# Patient Record
Sex: Male | Born: 1970 | Race: White | Hispanic: No | Marital: Married | Smoking: Never smoker
Health system: Southern US, Community
[De-identification: ages and names within clinical notes are randomized; demographics above are authoritative.]

## PROBLEM LIST (undated history)

## (undated) DIAGNOSIS — E119 Type 2 diabetes mellitus without complications: Secondary | ICD-10-CM

## (undated) DIAGNOSIS — K259 Gastric ulcer, unspecified as acute or chronic, without hemorrhage or perforation: Secondary | ICD-10-CM

## (undated) DIAGNOSIS — K5792 Diverticulitis of intestine, part unspecified, without perforation or abscess without bleeding: Secondary | ICD-10-CM

## (undated) DIAGNOSIS — J45909 Unspecified asthma, uncomplicated: Secondary | ICD-10-CM

## (undated) DIAGNOSIS — K589 Irritable bowel syndrome without diarrhea: Secondary | ICD-10-CM

## (undated) HISTORY — PX: COLON SURGERY: SHX602

---

## 2012-05-17 ENCOUNTER — Emergency Department (HOSPITAL_BASED_OUTPATIENT_CLINIC_OR_DEPARTMENT_OTHER)
Admission: EM | Admit: 2012-05-17 | Discharge: 2012-05-17 | Disposition: A | Discharge status: Home / Self Care | Payer: BC Managed Care – PPO | Attending: Emergency Medicine | Admitting: Emergency Medicine

## 2012-05-17 ENCOUNTER — Encounter (HOSPITAL_BASED_OUTPATIENT_CLINIC_OR_DEPARTMENT_OTHER): Payer: Self-pay | Admitting: Emergency Medicine

## 2012-05-17 DIAGNOSIS — R000 Tachycardia, unspecified: Secondary | ICD-10-CM | POA: Insufficient documentation

## 2012-05-17 DIAGNOSIS — R197 Diarrhea, unspecified: Secondary | ICD-10-CM | POA: Insufficient documentation

## 2012-05-17 DIAGNOSIS — J45909 Unspecified asthma, uncomplicated: Secondary | ICD-10-CM | POA: Insufficient documentation

## 2012-05-17 DIAGNOSIS — R112 Nausea with vomiting, unspecified: Secondary | ICD-10-CM | POA: Insufficient documentation

## 2012-05-17 DIAGNOSIS — E119 Type 2 diabetes mellitus without complications: Secondary | ICD-10-CM | POA: Insufficient documentation

## 2012-05-17 DIAGNOSIS — Z8719 Personal history of other diseases of the digestive system: Secondary | ICD-10-CM | POA: Insufficient documentation

## 2012-05-17 DIAGNOSIS — Z79899 Other long term (current) drug therapy: Secondary | ICD-10-CM | POA: Insufficient documentation

## 2012-05-17 HISTORY — DX: Irritable bowel syndrome, unspecified: K58.9

## 2012-05-17 HISTORY — DX: Type 2 diabetes mellitus without complications: E11.9

## 2012-05-17 HISTORY — DX: Unspecified asthma, uncomplicated: J45.909

## 2012-05-17 HISTORY — DX: Diverticulitis of intestine, part unspecified, without perforation or abscess without bleeding: K57.92

## 2012-05-17 LAB — BASIC METABOLIC PANEL
CO2: 23 mEq/L (ref 19–32)
Calcium: 9.4 mg/dL (ref 8.4–10.5)
Chloride: 102 mEq/L (ref 96–112)
Creatinine, Ser: 1 mg/dL (ref 0.50–1.35)
Glucose, Bld: 247 mg/dL — ABNORMAL HIGH (ref 70–99)

## 2012-05-17 MED ORDER — DICYCLOMINE HCL 10 MG PO CAPS
10.0000 mg | ORAL_CAPSULE | Freq: Once | ORAL | Status: AC
  Administered 2012-05-17: 10 mg via ORAL
  Filled 2012-05-17: qty 1

## 2012-05-17 MED ORDER — SODIUM CHLORIDE 0.9 % IV BOLUS (SEPSIS)
1000.0000 mL | Freq: Once | INTRAVENOUS | Status: AC
  Administered 2012-05-17: 1000 mL via INTRAVENOUS

## 2012-05-17 MED ORDER — ONDANSETRON 8 MG PO TBDP: ORAL_TABLET | ORAL | Status: DC

## 2012-05-17 MED ORDER — SODIUM CHLORIDE 0.9 % IV BOLUS (SEPSIS)
250.0000 mL | Freq: Once | INTRAVENOUS | Status: AC
  Administered 2012-05-17: 250 mL via INTRAVENOUS

## 2012-05-17 MED ORDER — SODIUM CHLORIDE 0.9 % IV BOLUS (SEPSIS): 500.0000 mL | Freq: Once | INTRAVENOUS | Status: DC

## 2012-05-17 MED ORDER — ONDANSETRON HCL 4 MG/2ML IJ SOLN
4.0000 mg | Freq: Once | INTRAMUSCULAR | Status: AC
  Administered 2012-05-17: 4 mg via INTRAVENOUS
  Filled 2012-05-17: qty 2

## 2012-05-17 MED ORDER — KETOROLAC TROMETHAMINE 30 MG/ML IJ SOLN
30.0000 mg | Freq: Once | INTRAMUSCULAR | Status: AC
  Administered 2012-05-17: 30 mg via INTRAVENOUS
  Filled 2012-05-17: qty 1

## 2012-05-17 NOTE — ED Notes (Signed)
MD at bedside. 

## 2012-05-17 NOTE — ED Provider Notes (Signed)
History     CSN: [id number]  Arrival date & time 05/17/12  7829   First MD Initiated Contact with Patient 05/17/12 0434      Chief Complaint  Patient presents with  . Emesis  . Diarrhea    (Consider location/radiation/quality/duration/timing/severity/associated sxs/prior treatment) Patient is a 41 y.o. male presenting with vomiting and diarrhea. The history is provided by the patient. No language interpreter was used.  Emesis Severity:  Moderate Timing:  Constant Quality:  Stomach contents Progression:  Unchanged Chronicity:  New Recent urination:  Normal Context: not post-tussive   Relieved by:  Nothing Worsened by:  Nothing tried Associated symptoms: diarrhea   Risk factors: sick contacts   Diarrhea Associated symptoms: vomiting   multiple family members with the same tonight  Past Medical History  Diagnosis Date  . Diabetes mellitus without complication   . Asthma   . Diverticulitis   . IBS (irritable bowel syndrome)     History reviewed. No pertinent past surgical history.  No family history on file.  History  Substance Use Topics  . Smoking status: Never Smoker   . Smokeless tobacco: Not on file  . Alcohol Use: No      Review of Systems  Gastrointestinal: Positive for vomiting and diarrhea.  All other systems reviewed and are negative.    Allergies  Biphosphate  Home Medications   Current Outpatient Rx  Name  Route  Sig  Dispense  Refill  . famotidine (PEPCID) 10 MG tablet   Oral   Take 10 mg by mouth 2 (two) times daily.         . fenofibrate (TRICOR) 145 MG tablet   Oral   Take 145 mg by mouth daily.         . sitaGLIPtan-metformin (JANUMET) 50-500 MG per tablet   Oral   Take 1 tablet by mouth 2 (two) times daily with a meal.           BP 127/95  Pulse 133  Temp(Src) 98.3 F (36.8 C) (Oral)  Resp 18  Ht 6\' 5"  (1.956 m)  Wt 311 lb (141.069 kg)  BMI 36.87 kg/m2  SpO2 96%  Physical Exam  Constitutional: He is  oriented to person, place, and time. He appears well-developed and well-nourished. No distress.  HENT:  Head: Normocephalic and atraumatic.  Mouth/Throat: Oropharynx is clear and moist.  Eyes: Conjunctivae are normal. Pupils are equal, round, and reactive to light.  Neck: Normal range of motion. Neck supple.  Cardiovascular: Intact distal pulses.  Tachycardia present.   Pulmonary/Chest: Effort normal and breath sounds normal. He has no wheezes. He has no rales.  Abdominal: Soft. Bowel sounds are normal. There is no tenderness. There is no rebound and no guarding.  Musculoskeletal: Normal range of motion.  Neurological: He is alert and oriented to person, place, and time.  Skin: Skin is warm and dry.  Psychiatric: He has a normal mood and affect.    ED Course  Procedures (including critical care time)  Labs Reviewed  BASIC METABOLIC PANEL   No results found.   No diagnosis found.    MDM  No further vomiting in the ED.  Multiple family members with same.  No indication for imaging at this time.  Return for worsening symptoms       Issiac Jamar K Tearsa Kowalewski-Rasch, MD 05/17/12 [id number]

## 2012-05-17 NOTE — ED Notes (Signed)
Vomiting and diarrhea x 2 hours.

## 2014-02-10 ENCOUNTER — Emergency Department (HOSPITAL_BASED_OUTPATIENT_CLINIC_OR_DEPARTMENT_OTHER): Payer: BC Managed Care – PPO

## 2014-02-10 ENCOUNTER — Encounter (HOSPITAL_BASED_OUTPATIENT_CLINIC_OR_DEPARTMENT_OTHER): Payer: Self-pay

## 2014-02-10 ENCOUNTER — Emergency Department (HOSPITAL_BASED_OUTPATIENT_CLINIC_OR_DEPARTMENT_OTHER)
Admission: EM | Admit: 2014-02-10 | Discharge: 2014-02-11 | Disposition: A | Discharge status: Home / Self Care | Payer: BC Managed Care – PPO | Attending: Emergency Medicine | Admitting: Emergency Medicine

## 2014-02-10 DIAGNOSIS — J45909 Unspecified asthma, uncomplicated: Secondary | ICD-10-CM | POA: Diagnosis not present

## 2014-02-10 DIAGNOSIS — Y9389 Activity, other specified: Secondary | ICD-10-CM | POA: Insufficient documentation

## 2014-02-10 DIAGNOSIS — Y998 Other external cause status: Secondary | ICD-10-CM | POA: Diagnosis not present

## 2014-02-10 DIAGNOSIS — L299 Pruritus, unspecified: Secondary | ICD-10-CM | POA: Diagnosis not present

## 2014-02-10 DIAGNOSIS — E119 Type 2 diabetes mellitus without complications: Secondary | ICD-10-CM | POA: Diagnosis not present

## 2014-02-10 DIAGNOSIS — Z79899 Other long term (current) drug therapy: Secondary | ICD-10-CM | POA: Diagnosis not present

## 2014-02-10 DIAGNOSIS — R1031 Right lower quadrant pain: Secondary | ICD-10-CM | POA: Insufficient documentation

## 2014-02-10 DIAGNOSIS — T373X5A Adverse effect of other antiprotozoal drugs, initial encounter: Secondary | ICD-10-CM | POA: Diagnosis not present

## 2014-02-10 DIAGNOSIS — Z880 Allergy status to penicillin: Secondary | ICD-10-CM | POA: Insufficient documentation

## 2014-02-10 DIAGNOSIS — Y9289 Other specified places as the place of occurrence of the external cause: Secondary | ICD-10-CM | POA: Insufficient documentation

## 2014-02-10 DIAGNOSIS — T7840XA Allergy, unspecified, initial encounter: Secondary | ICD-10-CM

## 2014-02-10 DIAGNOSIS — Z792 Long term (current) use of antibiotics: Secondary | ICD-10-CM | POA: Insufficient documentation

## 2014-02-10 DIAGNOSIS — R1032 Left lower quadrant pain: Secondary | ICD-10-CM | POA: Insufficient documentation

## 2014-02-10 DIAGNOSIS — R109 Unspecified abdominal pain: Secondary | ICD-10-CM

## 2014-02-10 DIAGNOSIS — R1310 Dysphagia, unspecified: Secondary | ICD-10-CM | POA: Insufficient documentation

## 2014-02-10 DIAGNOSIS — T368X5A Adverse effect of other systemic antibiotics, initial encounter: Secondary | ICD-10-CM | POA: Diagnosis not present

## 2014-02-10 LAB — URINALYSIS, ROUTINE W REFLEX MICROSCOPIC
Bilirubin Urine: NEGATIVE
Glucose, UA: NEGATIVE mg/dL
HGB URINE DIPSTICK: NEGATIVE
Ketones, ur: NEGATIVE mg/dL
LEUKOCYTES UA: NEGATIVE
NITRITE: NEGATIVE
Protein, ur: NEGATIVE mg/dL
SPECIFIC GRAVITY, URINE: 1.011 (ref 1.005–1.030)
UROBILINOGEN UA: 0.2 mg/dL (ref 0.0–1.0)
pH: 6.5 (ref 5.0–8.0)

## 2014-02-10 LAB — CBC WITH DIFFERENTIAL/PLATELET
BASOS ABS: 0.1 10*3/uL (ref 0.0–0.1)
BASOS PCT: 1 % (ref 0–1)
Eosinophils Absolute: 0.2 10*3/uL (ref 0.0–0.7)
Eosinophils Relative: 2 % (ref 0–5)
HCT: 41.1 % (ref 39.0–52.0)
HEMOGLOBIN: 13.6 g/dL (ref 13.0–17.0)
Lymphocytes Relative: 39 % (ref 12–46)
Lymphs Abs: 4.7 10*3/uL — ABNORMAL HIGH (ref 0.7–4.0)
MCH: 27.5 pg (ref 26.0–34.0)
MCHC: 33.1 g/dL (ref 30.0–36.0)
MCV: 83 fL (ref 78.0–100.0)
MONOS PCT: 7 % (ref 3–12)
Monocytes Absolute: 0.9 10*3/uL (ref 0.1–1.0)
NEUTROS ABS: 6.2 10*3/uL (ref 1.7–7.7)
NEUTROS PCT: 51 % (ref 43–77)
Platelets: 319 10*3/uL (ref 150–400)
RBC: 4.95 MIL/uL (ref 4.22–5.81)
RDW: 13.5 % (ref 11.5–15.5)
WBC: 12 10*3/uL — ABNORMAL HIGH (ref 4.0–10.5)

## 2014-02-10 LAB — COMPREHENSIVE METABOLIC PANEL
ALBUMIN: 3.7 g/dL (ref 3.5–5.2)
ALK PHOS: 26 U/L — AB (ref 39–117)
ALT: 25 U/L (ref 0–53)
AST: 17 U/L (ref 0–37)
Anion gap: 13 (ref 5–15)
BUN: 17 mg/dL (ref 6–23)
CO2: 22 mEq/L (ref 19–32)
Calcium: 8.8 mg/dL (ref 8.4–10.5)
Chloride: 102 mEq/L (ref 96–112)
Creatinine, Ser: 1.1 mg/dL (ref 0.50–1.35)
GFR calc Af Amer: >90 mL/min (ref >90)
GFR calc non Af Amer: 81 mL/min — ABNORMAL LOW (ref >90)
Glucose, Bld: 151 mg/dL — ABNORMAL HIGH (ref 70–99)
POTASSIUM: 3.8 meq/L (ref 3.7–5.3)
SODIUM: 137 meq/L (ref 137–147)
TOTAL PROTEIN: 6.6 g/dL (ref 6.0–8.3)
Total Bilirubin: 0.3 mg/dL (ref 0.3–1.2)

## 2014-02-10 MED ORDER — SODIUM CHLORIDE 0.9 % IV SOLN
Freq: Once | INTRAVENOUS | Status: AC
  Administered 2014-02-10: 500 mL via INTRAVENOUS

## 2014-02-10 MED ORDER — METHYLPREDNISOLONE SODIUM SUCC 125 MG IJ SOLR
INTRAMUSCULAR | Status: AC
  Administered 2014-02-10: 125 mg
  Filled 2014-02-10: qty 2

## 2014-02-10 MED ORDER — FAMOTIDINE IN NACL 20-0.9 MG/50ML-% IV SOLN
INTRAVENOUS | Status: AC
  Administered 2014-02-10: 20 mg
  Filled 2014-02-10: qty 50

## 2014-02-10 MED ORDER — DIPHENHYDRAMINE HCL 50 MG/ML IJ SOLN
INTRAMUSCULAR | Status: AC
  Administered 2014-02-10: 25 mg
  Filled 2014-02-10: qty 1

## 2014-02-10 NOTE — ED Provider Notes (Signed)
CSN: [id number]     Arrival date & time 02/10/14  2243 History  This chart was scribed for Geoffery Lyonsouglas Robbin Loughmiller, MD by Ronney LionSuzanne Le, ED Scribe. This patient was seen in room MH11/MH11 and the patient's care was started at 11:09 PM.    Chief Complaint  Patient presents with  . Allergic Reaction   Patient is a 43 y.o. male presenting with allergic reaction. The history is provided by the patient. No language interpreter was used.  Allergic Reaction Presenting symptoms: difficulty swallowing and itching   Difficulty swallowing:    Onset quality:  Sudden   Duration:  1 hour   Timing:  Unable to specify   Progression:  Partially resolved Itching:    Location:  Full body Severity:  Unable to specify Prior allergic episodes:  No prior episodes (No prior reactions to same medications.) Context: medications   Relieved by:  Nothing Worsened by:  Nothing tried Ineffective treatments:  None tried  HPI Comments: Richard Murray is a 43 y.o. male who presents to the Emergency Department complaining of a suspected allergic reaction to medication. After taking first dose of new course of cipro and flagyl at home, patient reports itching eyes and lips that spread to head, back, and feet that started about 8 minutes after taking it. He also reports that his hands got hot, and lips and tongue became numb. He also reports mild swelling of his tongue and mild dysphasia. Patient was taking the flagyl due to his doctor suspecting diverticulitis flareup. He has taken the same medication before with no problems. He reports abdominal pain mostly LLQ but some in RLQ that started one week ago. Recent blood work done by PCP suggested infection. The only recent change of medicine has been the addition of lisinopril, which he has been taking for several months. Patient denies SOB and fever.  Past Medical History  Diagnosis Date  . Diabetes mellitus without complication   . Asthma   . Diverticulitis   . IBS (irritable bowel  syndrome)   . Diverticulitis    History reviewed. No pertinent past surgical history. No family history on file. History  Substance Use Topics  . Smoking status: Never Smoker   . Smokeless tobacco: Not on file  . Alcohol Use: Yes    Review of Systems  Constitutional: Negative for fever.  HENT: Positive for trouble swallowing.   Respiratory: Negative for shortness of breath.   Gastrointestinal: Positive for abdominal pain.  Skin: Positive for itching.  All other systems reviewed and are negative.     Allergies  Biphosphate and Penicillins  Home Medications   Prior to Admission medications   Medication Sig Start Date End Date Taking? Authorizing Provider  Ciprofloxacin (CIPRO PO) Take by mouth.   Yes Historical Provider, MD  Liraglutide (VICTOZA [place]) Inject into the skin.   Yes Historical Provider, MD  MetroNIDAZOLE (FLAGYL PO) Take by mouth.   Yes Historical Provider, MD  famotidine (PEPCID) 10 MG tablet Take 10 mg by mouth 2 (two) times daily.    Historical Provider, MD  fenofibrate (TRICOR) 145 MG tablet Take 145 mg by mouth daily.    Historical Provider, MD  sitaGLIPtan-metformin (JANUMET) 50-500 MG per tablet Take 1 tablet by mouth 2 (two) times daily with a meal.    Historical Provider, MD   BP 141/92 mmHg  Pulse 118  Temp(Src) 97.9 F (36.6 C) (Oral)  Resp 26  Ht 6\' 5"  (1.956 m)  Wt 302 lb (136.986 kg)  BMI 35.80  kg/m2  SpO2 98% Physical Exam  Constitutional: He is oriented to person, place, and time. He appears well-developed. No distress.  HENT:  Head: Normocephalic and atraumatic.  Mouth/Throat: Oropharynx is clear and moist.  There is mild swelling of the tongue.   Eyes: Conjunctivae and EOM are normal.  Cardiovascular: Normal rate and regular rhythm.   Pulmonary/Chest: Effort normal. No stridor. No respiratory distress.  Abdominal: He exhibits no distension. There is tenderness. There is no rebound and no guarding.  TTP in the left and right lower  quadrants.  Musculoskeletal: He exhibits no edema.  Neurological: He is alert and oriented to person, place, and time.  Skin: Skin is warm and dry. No rash noted.  Psychiatric: He has a normal mood and affect.  Nursing note and vitals reviewed.   ED Course  Procedures (including critical care time)  DIAGNOSTIC STUDIES: Oxygen Saturation is 98% on room air, normal by my interpretation.    COORDINATION OF CARE: 11:15 PM-Discussed treatment plan which includes CAT scan with pt at bedside and pt agreed to plan.   Labs Review Labs Reviewed - No data to display  Imaging Review No results found.   EKG Interpretation None      MDM   Final diagnoses:  None    Patient presents here with complaints of itchy eyes, swelling of his hands and feet, and swelling to his tongue that started almost immediately after taking Cipro and Flagyl for presumed diverticulitis. He reports having some lower abdominal discomfort for the past several days. He was seen by his doctor and had blood work performed. This showed an elevated white count and was treated with these medications.  Workup today reveals a white count of 12,000, however CT scan is negative for diverticulitis or appendicitis. I will treat with prednisone and Benadryl for a presumed allergic reaction to his antibiotics. He is to return if his symptoms substantially worsen or change.  I have also considered the possibility of angioedema as the patient is taking an ACE inhibitor. Due to the timing of the reaction to the antibiotic, I have a higher suspicion that this was an allergy to either Cipro or Flagyl.  I personally performed the services described in this documentation, which was scribed in my presence. The recorded information has been reviewed and is accurate.      Geoffery Lyonsouglas Leyland Kenna, MD 02/11/14 [phone]

## 2014-02-10 NOTE — ED Notes (Signed)
After taking cipro and flagyl pt started having itching eyes, tingling in arms and feet, swelling to lips and tongue, no diff breathing,  Redness over body

## 2014-02-10 NOTE — ED Notes (Signed)
C/o eyes itching, tongue itching approx 10 min after meds at home-en route to ED pt c/o trouble swallowing

## 2014-02-11 MED ORDER — PREDNISONE 10 MG PO TABS: 20.0000 mg | ORAL_TABLET | Freq: Two times a day (BID) | ORAL | Status: DC

## 2014-02-11 MED ORDER — IOHEXOL 300 MG/ML  SOLN
100.0000 mL | Freq: Once | INTRAMUSCULAR | Status: AC | PRN
  Administered 2014-02-11: 100 mL via INTRAVENOUS

## 2014-02-11 MED ORDER — IOHEXOL 300 MG/ML  SOLN
25.0000 mL | Freq: Once | INTRAMUSCULAR | Status: AC | PRN
  Administered 2014-02-11: 25 mL via ORAL

## 2014-02-11 NOTE — ED Notes (Signed)
Patient transported to CT 

## 2014-02-11 NOTE — Discharge Instructions (Signed)
Prednisone as prescribed.  Benadryl 25 mg every 6 hours for the next 3 days.  Return to the emergency department if he develops difficulty breathing, worsening abdominal pain, bloody stool, or other new and concerning symptoms.   Drug Allergy Allergic reactions to medicines are common. Some allergic reactions are mild. A delayed type of drug allergy that occurs 1 week or more after exposure to a medicine or vaccine is called serum sickness. A life-threatening, sudden (acute) allergic reaction that involves the whole body is called anaphylaxis. CAUSES  "True" drug allergies occur when there is an allergic reaction to a medicine. This is caused by overactivity of the immune system. First, the body becomes sensitized. The immune system is triggered by your first exposure to the medicine. Following this first exposure, future exposure to the same medicine may be life-threatening. Almost any medicine can cause an allergic reaction. Common ones are:  Penicillin.  Sulfonamides (sulfa drugs).  Local anesthetics.  X-ray dyes that contain iodine. SYMPTOMS  Common symptoms of a minor allergic reaction are:  Swelling around the mouth.  An itchy red rash or hives.  Vomiting or diarrhea. Anaphylaxis can cause swelling of the mouth and throat. This makes it difficult to breathe and swallow. Severe reactions can be fatal within seconds, even after exposure to only a trace amount of the drug that causes the reaction. HOME CARE INSTRUCTIONS   If you are unsure of what caused your reaction, keep a diary of foods and medicines used. Include the symptoms that followed. Avoid anything that causes reactions.  You may want to follow up with an allergy specialist after the reaction has cleared in order to be tested to confirm the allergy. It is important to confirm that your reaction is an allergy, not just a side effect to the medicine. If you have a true allergy to a medicine, this may prevent that  medicine and related medicines from being given to you when you are very ill.  If you have hives or a rash:  Take medicines as directed by your caregiver.  You may use an over-the-counter antihistamine (diphenhydramine) as needed.  Apply cold compresses to the skin or take baths in cool water. Avoid hot baths or showers.  If you are severely allergic:  Continuous observation after a severe reaction may be needed. Hospitalization is often required.  Wear a medical alert bracelet or necklace stating your allergy.  You and your family must learn how to use an anaphylaxis kit or give an epinephrine injection to temporarily treat an emergency allergic reaction. If you have had a severe reaction, always carry your epinephrine injection or anaphylaxis kit with you. This can be lifesaving if you have a severe reaction.  Do not drive or perform tasks after treatment until the medicines used to treat your reaction have worn off, or until your caregiver says it is okay. SEEK MEDICAL CARE IF:   You think you had an allergic reaction. Symptoms usually start within 30 minutes after exposure.  Symptoms are getting worse rather than better.  You develop new symptoms.  The symptoms that brought you to your caregiver return. SEEK IMMEDIATE MEDICAL CARE IF:   You have swelling of the mouth, difficulty breathing, or wheezing.  You have a tight feeling in your chest or throat.  You develop hives, swelling, or itching all over your body.  You develop severe vomiting or diarrhea.  You feel faint or pass out. This is an emergency. Use your epinephrine injection or anaphylaxis  kit as you have been instructed. Call for emergency medical help. Even if you improve after the injection, you need to be examined at a hospital emergency department. MAKE SURE YOU:   Understand these instructions.  Will watch your condition.  Will get help right away if you are not doing well or get worse. Document  Released: 03/19/2005 Document Revised: 06/11/2011 Document Reviewed: 08/23/2010 [facility] Patient Information 2015 [place], [place]. This information is not intended to replace advice given to you by your health care provider. Make sure you discuss any questions you have with your health care provider.  Abdominal Pain Many things can cause abdominal pain. Usually, abdominal pain is not caused by a disease and will improve without treatment. It can often be observed and treated at home. Your health care provider will do a physical exam and possibly order blood tests and X-rays to help determine the seriousness of your pain. However, in many cases, more time must pass before a clear cause of the pain can be found. Before that point, your health care provider may not know if you need more testing or further treatment. HOME CARE INSTRUCTIONS  Monitor your abdominal pain for any changes. The following actions may help to alleviate any discomfort you are experiencing:  Only take over-the-counter or prescription medicines as directed by your health care provider.  Do not take laxatives unless directed to do so by your health care provider.  Try a clear liquid diet (broth, tea, or water) as directed by your health care provider. Slowly move to a bland diet as tolerated. SEEK MEDICAL CARE IF:  You have unexplained abdominal pain.  You have abdominal pain associated with nausea or diarrhea.  You have pain when you urinate or have a bowel movement.  You experience abdominal pain that wakes you in the night.  You have abdominal pain that is worsened or improved by eating food.  You have abdominal pain that is worsened with eating fatty foods.  You have a fever. SEEK IMMEDIATE MEDICAL CARE IF:   Your pain does not go away within 2 hours.  You keep throwing up (vomiting).  Your pain is felt only in portions of the abdomen, such as the right side or the left lower portion of the abdomen.  You pass  bloody or black tarry stools. MAKE SURE YOU:  Understand these instructions.   Will watch your condition.   Will get help right away if you are not doing well or get worse.  Document Released: 12/27/2004 Document Revised: 03/24/2013 Document Reviewed: 11/26/2012 [facility] Patient Information 2015 [place], [place]. This information is not intended to replace advice given to you by your health care provider. Make sure you discuss any questions you have with your health care provider.

## 2014-02-11 NOTE — ED Notes (Signed)
Swelling is less  Pt states he feels a lot better

## 2015-01-12 ENCOUNTER — Emergency Department (HOSPITAL_BASED_OUTPATIENT_CLINIC_OR_DEPARTMENT_OTHER): Payer: BLUE CROSS/BLUE SHIELD

## 2015-01-12 ENCOUNTER — Encounter (HOSPITAL_BASED_OUTPATIENT_CLINIC_OR_DEPARTMENT_OTHER): Payer: Self-pay

## 2015-01-12 ENCOUNTER — Emergency Department (HOSPITAL_BASED_OUTPATIENT_CLINIC_OR_DEPARTMENT_OTHER)
Admission: EM | Admit: 2015-01-12 | Discharge: 2015-01-13 | Disposition: A | Discharge status: Other Acute Inpatient Hospital | Payer: BLUE CROSS/BLUE SHIELD | Attending: Emergency Medicine | Admitting: Emergency Medicine

## 2015-01-12 DIAGNOSIS — Z7984 Long term (current) use of oral hypoglycemic drugs: Secondary | ICD-10-CM | POA: Insufficient documentation

## 2015-01-12 DIAGNOSIS — K5732 Diverticulitis of large intestine without perforation or abscess without bleeding: Secondary | ICD-10-CM | POA: Diagnosis not present

## 2015-01-12 DIAGNOSIS — Z794 Long term (current) use of insulin: Secondary | ICD-10-CM | POA: Insufficient documentation

## 2015-01-12 DIAGNOSIS — E119 Type 2 diabetes mellitus without complications: Secondary | ICD-10-CM | POA: Insufficient documentation

## 2015-01-12 DIAGNOSIS — Z79899 Other long term (current) drug therapy: Secondary | ICD-10-CM | POA: Diagnosis not present

## 2015-01-12 DIAGNOSIS — R109 Unspecified abdominal pain: Secondary | ICD-10-CM | POA: Diagnosis present

## 2015-01-12 DIAGNOSIS — M545 Low back pain: Secondary | ICD-10-CM | POA: Diagnosis not present

## 2015-01-12 DIAGNOSIS — J45909 Unspecified asthma, uncomplicated: Secondary | ICD-10-CM | POA: Diagnosis not present

## 2015-01-12 DIAGNOSIS — Z880 Allergy status to penicillin: Secondary | ICD-10-CM | POA: Insufficient documentation

## 2015-01-12 DIAGNOSIS — K5792 Diverticulitis of intestine, part unspecified, without perforation or abscess without bleeding: Secondary | ICD-10-CM

## 2015-01-12 HISTORY — DX: Gastric ulcer, unspecified as acute or chronic, without hemorrhage or perforation: K25.9

## 2015-01-12 LAB — DIFFERENTIAL
BASOS PCT: 1 %
Basophils Absolute: 0.1 10*3/uL (ref 0.0–0.1)
Eosinophils Absolute: 0.3 10*3/uL (ref 0.0–0.7)
Eosinophils Relative: 2 %
LYMPHS ABS: 3.3 10*3/uL (ref 0.7–4.0)
LYMPHS PCT: 21 %
MONO ABS: 1.2 10*3/uL — AB (ref 0.1–1.0)
MONOS PCT: 8 %
NEUTROS ABS: 10.7 10*3/uL — AB (ref 1.7–7.7)
Neutrophils Relative %: 68 %

## 2015-01-12 LAB — CBC
HEMATOCRIT: 42.5 % (ref 39.0–52.0)
HEMOGLOBIN: 14.1 g/dL (ref 13.0–17.0)
MCH: 27.5 pg (ref 26.0–34.0)
MCHC: 33.2 g/dL (ref 30.0–36.0)
MCV: 82.8 fL (ref 78.0–100.0)
Platelets: 299 10*3/uL (ref 150–400)
RBC: 5.13 MIL/uL (ref 4.22–5.81)
RDW: 13.5 % (ref 11.5–15.5)
WBC: 15.6 10*3/uL — AB (ref 4.0–10.5)

## 2015-01-12 LAB — URINALYSIS, ROUTINE W REFLEX MICROSCOPIC
Bilirubin Urine: NEGATIVE
GLUCOSE, UA: NEGATIVE mg/dL
HGB URINE DIPSTICK: NEGATIVE
Ketones, ur: NEGATIVE mg/dL
Leukocytes, UA: NEGATIVE
Nitrite: NEGATIVE
Protein, ur: NEGATIVE mg/dL
SPECIFIC GRAVITY, URINE: 1.02 (ref 1.005–1.030)
UROBILINOGEN UA: 0.2 mg/dL (ref 0.0–1.0)
pH: 6.5 (ref 5.0–8.0)

## 2015-01-12 LAB — BASIC METABOLIC PANEL
Anion gap: 9 (ref 5–15)
BUN: 13 mg/dL (ref 6–20)
CHLORIDE: 102 mmol/L (ref 101–111)
CO2: 26 mmol/L (ref 22–32)
Calcium: 9.2 mg/dL (ref 8.9–10.3)
Creatinine, Ser: 0.97 mg/dL (ref 0.61–1.24)
GFR calc Af Amer: >60 mL/min (ref >60)
GFR calc non Af Amer: >60 mL/min (ref >60)
Glucose, Bld: 136 mg/dL — ABNORMAL HIGH (ref 65–99)
POTASSIUM: 3.9 mmol/L (ref 3.5–5.1)
SODIUM: 137 mmol/L (ref 135–145)

## 2015-01-12 LAB — OCCULT BLOOD X 1 CARD TO LAB, STOOL: Fecal Occult Bld: NEGATIVE

## 2015-01-12 MED ORDER — MORPHINE SULFATE (PF) 4 MG/ML IV SOLN
4.0000 mg | Freq: Once | INTRAVENOUS | Status: AC
  Administered 2015-01-12: 4 mg via INTRAVENOUS
  Filled 2015-01-12: qty 1

## 2015-01-12 MED ORDER — ONDANSETRON HCL 4 MG/2ML IJ SOLN
4.0000 mg | Freq: Once | INTRAMUSCULAR | Status: AC
  Administered 2015-01-12: 4 mg via INTRAVENOUS
  Filled 2015-01-12: qty 2

## 2015-01-12 MED ORDER — HYDROMORPHONE HCL 1 MG/ML IJ SOLN
1.0000 mg | Freq: Once | INTRAMUSCULAR | Status: AC
  Administered 2015-01-12: 1 mg via INTRAVENOUS
  Filled 2015-01-12: qty 1

## 2015-01-12 MED ORDER — PIPERACILLIN-TAZOBACTAM 3.375 G IVPB 30 MIN
3.3750 g | Freq: Once | INTRAVENOUS | Status: AC
  Administered 2015-01-12: 3.375 g via INTRAVENOUS
  Filled 2015-01-12 (×2): qty 50

## 2015-01-12 MED ORDER — SODIUM CHLORIDE 0.9 % IV BOLUS (SEPSIS)
1000.0000 mL | Freq: Once | INTRAVENOUS | Status: AC
  Administered 2015-01-12: 1000 mL via INTRAVENOUS

## 2015-01-12 MED ORDER — IOHEXOL 300 MG/ML  SOLN
125.0000 mL | Freq: Once | INTRAMUSCULAR | Status: AC | PRN
  Administered 2015-01-12: 125 mL via INTRAVENOUS

## 2015-01-12 MED ORDER — IOHEXOL 300 MG/ML  SOLN
50.0000 mL | Freq: Once | INTRAMUSCULAR | Status: AC | PRN
  Administered 2015-01-12: 50 mL via ORAL

## 2015-01-12 NOTE — ED Provider Notes (Signed)
CSN: [id number]   Arrival date & time 01/12/15 1925  History  By signing my name below, I, Bethel Born, attest that this documentation has been prepared under the direction and in the presence of Richardean Canal, MD. Electronically Signed: Bethel Born, ED Scribe. 01/12/2015. 9:11 PM.  Chief Complaint  Patient presents with  . Flank Pain    HPI The history is provided by the patient. No language interpreter was used.   Richard Murray is a 44 y.o. male with history of diverticulitis s/p partial colectomy, IBS, a bleeding ulcer who presents to the Emergency Department complaining of constant and worsening left-sided abdominal pain with onset 3 days ago. Associated symptoms include increasingly dark stool over the last 3 days (notes 1 definitively black stool today), nausea, and new back pain. Pt denies fever, vomiting, constipation, diarrhea, and dysuria. He saw his PCP this morning where he had lab work done that he states showed a small amount of blood in his urine and normal LFTs. He has a scheduled CT scan in 2 days.    Past Medical History  Diagnosis Date  . Diabetes mellitus without complication (HCC)   . Asthma   . Diverticulitis   . IBS (irritable bowel syndrome)   . Diverticulitis   . Gastric ulcer     Past Surgical History  Procedure Laterality Date  . Colon surgery      No family history on file.  Social History  Substance Use Topics  . Smoking status: Never Smoker   . Smokeless tobacco: None  . Alcohol Use: No     Review of Systems  Constitutional: Negative for fever.  Gastrointestinal: Positive for nausea and abdominal pain. Negative for vomiting, diarrhea and constipation.       Dark stool  Genitourinary: Negative for dysuria.  Musculoskeletal: Positive for back pain.   Home Medications   Prior to Admission medications   Medication Sig Start Date End Date Taking? Authorizing Provider  Ascorbic Acid (VITAMIN C) 100 MG tablet Take 100 mg by mouth daily.    Yes Historical Provider, MD  Cholecalciferol (VITAMIN D PO) Take by mouth.   Yes Historical Provider, MD  Inulin (FIBER CHOICE PO) Take by mouth.   Yes Historical Provider, MD  LISINOPRIL PO Take by mouth.   Yes Historical Provider, MD  famotidine (PEPCID) 10 MG tablet Take 10 mg by mouth 2 (two) times daily.    Historical Provider, MD  fenofibrate (TRICOR) 145 MG tablet Take 145 mg by mouth daily.    Historical Provider, MD  Liraglutide (VICTOZA [place]) Inject into the skin.    Historical Provider, MD  sitaGLIPtan-metformin (JANUMET) 50-500 MG per tablet Take 1 tablet by mouth 2 (two) times daily with a meal.    Historical Provider, MD    Allergies  Biphosphate; Ciprofloxacin hcl; Flagyl; and Penicillins  Triage Vitals: BP 131/95 mmHg  Pulse 101  Temp(Src) 99.5 F (37.5 C) (Oral)  Resp 18  Ht  (1.981 m)  Wt 303 lb (137.44 kg)  BMI 35.02 kg/m2  SpO2 98%  Physical Exam  Constitutional: He is oriented to person, place, and time. He appears well-developed and well-nourished.  HENT:  Head: Normocephalic and atraumatic.  Eyes: EOM are normal.  Neck: Normal range of motion.  Cardiovascular: Normal rate, regular rhythm, normal heart sounds and intact distal pulses.   Pulmonary/Chest: Effort normal and breath sounds normal. No respiratory distress.  Abdominal: Soft. He exhibits no distension. There is tenderness (LLQ). There is no rebound.  Musculoskeletal: Normal range of motion.  Neurological: He is alert and oriented to person, place, and time.  Skin: Skin is warm and dry.  Psychiatric: He has a normal mood and affect. Judgment normal.  Nursing note and vitals reviewed.   ED Course  Procedures   DIAGNOSTIC STUDIES: Oxygen Saturation is 98% on RA, normal by my interpretation.    COORDINATION OF CARE: 9:05 PM Discussed treatment plan which includes lab work, CT A/P with contrast, Zofran, morphine, IVF with pt at bedside and pt agreed to plan.  Labs Reviewed  BASIC  METABOLIC PANEL - Abnormal; Notable for the following:    Glucose, Bld 136 (*)    All other components within normal limits  CBC - Abnormal; Notable for the following:    WBC 15.6 (*)    All other components within normal limits  DIFFERENTIAL - Abnormal; Notable for the following:    Neutro Abs 10.7 (*)    Monocytes Absolute 1.2 (*)    All other components within normal limits  URINALYSIS, ROUTINE W REFLEX MICROSCOPIC (NOT AT [facility])  OCCULT BLOOD X 1 CARD TO LAB, STOOL    Imaging Review Ct Abdomen Pelvis W Contrast  01/12/2015  CLINICAL DATA:  Left lower quadrant pain for 3 days. Dark stools. White cell count 15.6. Left colectomy for diverticulitis on 03/16. EXAM: CT ABDOMEN AND PELVIS WITH CONTRAST TECHNIQUE: Multidetector CT imaging of the abdomen and pelvis was performed using the standard protocol following bolus administration of intravenous contrast. CONTRAST:  OMNIPAQUE IOHEXOL 300 MG/ML SOLN, 50mL OMNIPAQUE IOHEXOL 300 MG/ML SOLN COMPARISON:  02/11/2014 FINDINGS: Mild dependent changes in the lung bases. Diffuse fatty infiltration of the liver. No focal liver lesions. Small stone in the gallbladder neck. No gallbladder wall thickening or inflammation. The pancreas, adrenal glands, abdominal aorta, inferior vena cava, and retroperitoneal lymph nodes are unremarkable. Small cyst in the lower pole of the left kidney measuring 1.6 cm diameter. No change since prior study. Renal nephrograms are symmetrical. No hydronephrosis or hydroureter. Sub cm focal low-attenuation lesion in the inferior spleen likely represents a small cyst or hemangioma. This is unchanged since previous study. Stomach and small bowel appear normal. Scattered stool in the colon without abnormal distention. Scattered diverticula in the colon. In the mid to low descending colon, there is focal wall thickening with pericolonic infiltration consistent with acute diverticulitis. No abscess. No free air or free fluid in the  abdomen. Small umbilical hernia containing fat. Pelvis: Appendix is normal. Surgical anastomosis in the mid sigmoid region. Mild infiltration in the adjacent sigmoid colon fat possibly due to fat necrosis in the postoperative setting. No fluid collections demonstrated in the pelvis. Scattered colonic diverticula. Bladder is decompressed. Calcification in the prostate gland. No pelvic mass or lymphadenopathy. IMPRESSION: Diverticulosis of the colon with inflammatory changes around the mid/ distal descending colon consistent with diverticulitis. No abscess. Probable postoperative fat necrosis adjacent to the sigmoid colon. Small umbilical hernia containing fat. Diffuse fatty infiltration of the liver. Cholelithiasis without inflammatory change. Electronically Signed   By: Burman Nieves M.D.   On: 01/12/2015 23:12    I personally reviewed and evaluated these images and lab results as a part of my medical decision-making.    MDM   Final diagnoses:  None   Arshawn Valdez is a 44 y.o. male here with LLQ, dark stools. Consider diverticulitis since patient has recurrent diverticulitis requiring surgery. Also consider GI bleed given dark stools. Will check labs, guiac, CT ab/pel.   11:41  PM CT showed diverticulitis, postop fat necrocis adjacent to sigmoid colon, no abscess. WBC 15. Has multiple allergies but tolerated augmentin and amoxicillin in the past. Will try zosyn. Will admit to [facility]. Dr. Pilar GrammesHounnou accepting doctor.   I personally performed the services described in this documentation, which was scribed in my presence. The recorded information has been reviewed and is accurate.   Richardean Canalavid H Nechelle Petrizzo, MD 01/12/15 [phone]

## 2015-01-12 NOTE — ED Notes (Signed)
Patient transported to CT 

## 2015-01-12 NOTE — ED Notes (Signed)
MD at bedside. 

## 2015-01-12 NOTE — ED Notes (Signed)
Left flank pain x 3 days-was seen by PCP today for same-mid bad pain started this afternoon-dark stools x 3 days-did not address with PCP today with black stool later today

## 2015-01-13 DIAGNOSIS — Z79899 Other long term (current) drug therapy: Secondary | ICD-10-CM | POA: Diagnosis not present

## 2015-01-13 DIAGNOSIS — E119 Type 2 diabetes mellitus without complications: Secondary | ICD-10-CM | POA: Diagnosis not present

## 2015-01-13 DIAGNOSIS — Z7984 Long term (current) use of oral hypoglycemic drugs: Secondary | ICD-10-CM | POA: Diagnosis not present

## 2015-01-13 DIAGNOSIS — R109 Unspecified abdominal pain: Secondary | ICD-10-CM | POA: Diagnosis present

## 2015-01-13 DIAGNOSIS — K5732 Diverticulitis of large intestine without perforation or abscess without bleeding: Secondary | ICD-10-CM | POA: Diagnosis not present

## 2015-01-13 DIAGNOSIS — J45909 Unspecified asthma, uncomplicated: Secondary | ICD-10-CM | POA: Diagnosis not present

## 2015-01-13 DIAGNOSIS — M545 Low back pain: Secondary | ICD-10-CM | POA: Diagnosis not present

## 2015-01-13 DIAGNOSIS — Z880 Allergy status to penicillin: Secondary | ICD-10-CM | POA: Diagnosis not present

## 2015-01-13 DIAGNOSIS — Z794 Long term (current) use of insulin: Secondary | ICD-10-CM | POA: Diagnosis not present

## 2015-01-13 MED ORDER — HYDROMORPHONE HCL 1 MG/ML IJ SOLN
1.0000 mg | Freq: Once | INTRAMUSCULAR | Status: DC | PRN
  Administered 2015-01-13: 1 mg via INTRAVENOUS
  Filled 2015-01-13: qty 1

## 2016-05-16 ENCOUNTER — Encounter (HOSPITAL_BASED_OUTPATIENT_CLINIC_OR_DEPARTMENT_OTHER): Payer: Self-pay | Admitting: Emergency Medicine

## 2016-05-16 ENCOUNTER — Emergency Department (HOSPITAL_BASED_OUTPATIENT_CLINIC_OR_DEPARTMENT_OTHER)
Admission: EM | Admit: 2016-05-16 | Discharge: 2016-05-16 | Disposition: A | Discharge status: Home / Self Care | Payer: 59 | Attending: Emergency Medicine | Admitting: Emergency Medicine

## 2016-05-16 DIAGNOSIS — R110 Nausea: Secondary | ICD-10-CM | POA: Insufficient documentation

## 2016-05-16 DIAGNOSIS — M791 Myalgia: Secondary | ICD-10-CM | POA: Insufficient documentation

## 2016-05-16 DIAGNOSIS — I10 Essential (primary) hypertension: Secondary | ICD-10-CM | POA: Insufficient documentation

## 2016-05-16 DIAGNOSIS — J111 Influenza due to unidentified influenza virus with other respiratory manifestations: Secondary | ICD-10-CM

## 2016-05-16 DIAGNOSIS — R05 Cough: Secondary | ICD-10-CM | POA: Diagnosis present

## 2016-05-16 DIAGNOSIS — E119 Type 2 diabetes mellitus without complications: Secondary | ICD-10-CM | POA: Diagnosis not present

## 2016-05-16 DIAGNOSIS — R509 Fever, unspecified: Secondary | ICD-10-CM | POA: Insufficient documentation

## 2016-05-16 DIAGNOSIS — R69 Illness, unspecified: Secondary | ICD-10-CM

## 2016-05-16 MED ORDER — ONDANSETRON 4 MG PO TBDP
4.0000 mg | ORAL_TABLET | Freq: Once | ORAL | Status: AC
Start: 2016-05-16 — End: 2016-05-16
  Administered 2016-05-16: 4 mg via ORAL
  Filled 2016-05-16: qty 1

## 2016-05-16 MED ORDER — OSELTAMIVIR PHOSPHATE 75 MG PO CAPS: 75.0000 mg | ORAL_CAPSULE | Freq: Two times a day (BID) | ORAL | 0 refills | Status: DC

## 2016-05-16 MED ORDER — ONDANSETRON 4 MG PO TBDP: 4.0000 mg | ORAL_TABLET | Freq: Three times a day (TID) | ORAL | 0 refills | Status: DC | PRN

## 2016-05-16 NOTE — ED Provider Notes (Signed)
[facility]-EMERGENCY DEPT Provider Note   CSN: [id number] Arrival date & time: 05/16/16  2030  By signing my name below, I, Doreatha [id number] Mathews, attest that this documentation has been prepared under the direction and in the presence of Jerelyn ScottMartha Linker, MD. Electronically Signed: Doreatha MartinEva Mathews, ED Scribe. 05/16/16. 10:48 PM.     History   Chief Complaint Chief Complaint  Patient presents with  . URI    HPI Richard Murray is a 46 y.o. male with h/o asthma who presents to the Emergency Department complaining of moderate, sudden onset generalized body aches that began this afternoon with associated subjective fever, chills, productive cough, nausea. Per pt, he was ill with a viral illness 2 weeks ago and was feeling otherwise well until today. Pt states his cough is worsened with exertion. No alleviating factors noted. He reports sick contact with other family members and co-workers who have the flu. Pt reports he had a flu shot this year. Pt is a non-smoker. He denies vomiting.   The history is provided by the patient. No language interpreter was used.  Influenza  Presenting symptoms: cough, fever (subjective), myalgias (generalized) and nausea   Presenting symptoms: no vomiting   Severity:  Moderate Onset quality:  Sudden Duration:  1 day Progression:  Worsening Chronicity:  New Relieved by:  Nothing Worsened by:  Movement Ineffective treatments:  None tried Associated symptoms: chills   Risk factors: sick contacts     Past Medical History:  Diagnosis Date  . Asthma   . Diabetes mellitus without complication (HCC)   . Diverticulitis   . Diverticulitis   . Gastric ulcer   . IBS (irritable bowel syndrome)     There are no active problems to display for this patient.   Past Surgical History:  Procedure Laterality Date  . COLON SURGERY         Home Medications    Prior to Admission medications   Medication Sig Start Date End Date Taking? Authorizing Provider  Ascorbic Acid  (VITAMIN C) 100 MG tablet Take 100 mg by mouth daily.    Historical Provider, MD  Cholecalciferol (VITAMIN D PO) Take by mouth.    Historical Provider, MD  famotidine (PEPCID) 10 MG tablet Take 10 mg by mouth 2 (two) times daily.    Historical Provider, MD  fenofibrate (TRICOR) 145 MG tablet Take 145 mg by mouth daily.    Historical Provider, MD  Inulin (FIBER CHOICE PO) Take by mouth.    Historical Provider, MD  Liraglutide (VICTOZA [place]) Inject into the skin.    Historical Provider, MD  LISINOPRIL PO Take by mouth.    Historical Provider, MD  ondansetron (ZOFRAN ODT) 4 MG disintegrating tablet Take 1 tablet (4 mg total) by mouth every 8 (eight) hours as needed for nausea or vomiting. 05/16/16   Jerelyn ScottMartha Linker, MD  oseltamivir (TAMIFLU) 75 MG capsule Take 1 capsule (75 mg total) by mouth every 12 (twelve) hours. 05/16/16   Jerelyn ScottMartha Linker, MD  sitaGLIPtan-metformin (JANUMET) 50-500 MG per tablet Take 1 tablet by mouth 2 (two) times daily with a meal.    Historical Provider, MD    Family History No family history on file.  Social History Social History  Substance Use Topics  . Smoking status: Never Smoker  . Smokeless tobacco: Never Used  . Alcohol use No     Allergies   Biphosphate; Ciprofloxacin hcl; Flagyl [metronidazole]; and Penicillins   Review of Systems Review of Systems  Constitutional: Positive for chills and fever (subjective).  Respiratory: Positive for cough.   Gastrointestinal: Positive for nausea. Negative for vomiting.  Musculoskeletal: Positive for myalgias (generalized).  All other systems reviewed and are negative.    Physical Exam Updated Vital Signs BP 120/82 (BP Location: Right Arm)   Pulse 84   Temp 98.4 F (36.9 C) (Oral)   Resp 20   Ht 6\' 5"  (1.956 m)   Wt 136.1 kg   SpO2 95%   BMI 35.57 kg/m  Vitals reviewed Physical Exam Physical Examination: General appearance - alert, well appearing, and in no distress Mental status - alert, oriented to  person, place, and time Eyes -no conjunctival injection, no scleral icterus Mouth - mucous membranes moist, pharynx normal without lesions Neck - supple, no significant adenopathy Chest - clear to auscultation, no wheezes, rales or rhonchi, symmetric air entry, normal respiratory effort Heart - normal rate, regular rhythm, normal S1, S2, no murmurs, rubs, clicks or gallops Abdomen - soft, nontender, nondistended, no masses or organomegaly Neurological - alert, oriented, normal speech Extremities - peripheral pulses normal, no pedal edema, no clubbing or cyanosis Skin - normal coloration and turgor, no rashes  ED Treatments / Results   DIAGNOSTIC STUDIES: Oxygen Saturation is 94% on RA, adequate by my interpretation.    COORDINATION OF CARE: 10:38 PM Discussed treatment plan with pt at bedside which includes Tamiflu and pt agreed to plan.    Labs (all labs ordered are listed, but only abnormal results are displayed) Labs Reviewed - No data to display  EKG  EKG Interpretation None       Radiology No results found.  Procedures Procedures (including critical care time)  Medications Ordered in ED Medications  ondansetron (ZOFRAN-ODT) disintegrating tablet 4 mg (4 mg Oral Given 05/16/16 2244)     Initial Impression / Assessment and Plan / ED Course  I have reviewed the triage vital signs and the nursing notes.  Pertinent labs & imaging results that were available during my care of the patient were reviewed by me and considered in my medical decision making (see chart for details).     Pt presenting with acute onset of symptoms most c/w influenza- fever/body aches, cough, congestion.  He has no tachypnea or hypoxia to suggest pneumonia.  Appears well hydrated.  He does c/o nausea but no vomiting, this was treated with zofran.  Will start on tamiflu, given rx for zofran as well.  Discharged with strict return precautions.  Pt agreeable with plan.  Final Clinical  Impressions(s) / ED Diagnoses   Final diagnoses:  Influenza-like illness    New Prescriptions Discharge Medication List as of 05/16/2016 10:55 PM    START taking these medications   Details  ondansetron (ZOFRAN ODT) 4 MG disintegrating tablet Take 1 tablet (4 mg total) by mouth every 8 (eight) hours as needed for nausea or vomiting., Starting Wed 05/16/2016, Print    oseltamivir (TAMIFLU) 75 MG capsule Take 1 capsule (75 mg total) by mouth every 12 (twelve) hours., Starting Wed 05/16/2016, Print        I personally performed the services described in this documentation, which was scribed in my presence. The recorded information has been reviewed and is accurate.     [name], MD 05/17/16 2133

## 2016-05-16 NOTE — Discharge Instructions (Signed)
Return to the ED with any concerns including difficulty breathing, vomiting and not able to keep down liquids, decreased urine output, decreased level of alertness/lethargy, or any other alarming symptoms  °

## 2016-05-16 NOTE — ED Triage Notes (Signed)
Fever, chills, runny nose, congestion, aches and pains, cough, and some nausea.

## 2016-09-05 ENCOUNTER — Emergency Department (HOSPITAL_BASED_OUTPATIENT_CLINIC_OR_DEPARTMENT_OTHER)
Admission: EM | Admit: 2016-09-05 | Discharge: 2016-09-06 | Disposition: A | Discharge status: Home / Self Care | Payer: 59 | Attending: Emergency Medicine | Admitting: Emergency Medicine

## 2016-09-05 ENCOUNTER — Emergency Department (HOSPITAL_BASED_OUTPATIENT_CLINIC_OR_DEPARTMENT_OTHER): Payer: 59

## 2016-09-05 ENCOUNTER — Encounter (HOSPITAL_BASED_OUTPATIENT_CLINIC_OR_DEPARTMENT_OTHER): Payer: Self-pay

## 2016-09-05 DIAGNOSIS — K5732 Diverticulitis of large intestine without perforation or abscess without bleeding: Secondary | ICD-10-CM | POA: Insufficient documentation

## 2016-09-05 DIAGNOSIS — K5792 Diverticulitis of intestine, part unspecified, without perforation or abscess without bleeding: Secondary | ICD-10-CM

## 2016-09-05 DIAGNOSIS — E119 Type 2 diabetes mellitus without complications: Secondary | ICD-10-CM | POA: Insufficient documentation

## 2016-09-05 DIAGNOSIS — J45909 Unspecified asthma, uncomplicated: Secondary | ICD-10-CM | POA: Diagnosis not present

## 2016-09-05 DIAGNOSIS — R1084 Generalized abdominal pain: Secondary | ICD-10-CM | POA: Diagnosis present

## 2016-09-05 LAB — CBC WITH DIFFERENTIAL/PLATELET
Basophils Absolute: 0.1 10*3/uL (ref 0.0–0.1)
Basophils Relative: 1 %
Eosinophils Absolute: 0.1 10*3/uL (ref 0.0–0.7)
Eosinophils Relative: 1 %
HCT: 44.4 % (ref 39.0–52.0)
Hemoglobin: 14.9 g/dL (ref 13.0–17.0)
LYMPHS ABS: 2.9 10*3/uL (ref 0.7–4.0)
LYMPHS PCT: 19 %
MCH: 27.4 pg (ref 26.0–34.0)
MCHC: 33.6 g/dL (ref 30.0–36.0)
MCV: 81.6 fL (ref 78.0–100.0)
MONO ABS: 1.1 10*3/uL — AB (ref 0.1–1.0)
Monocytes Relative: 8 %
Neutro Abs: 10.6 10*3/uL — ABNORMAL HIGH (ref 1.7–7.7)
Neutrophils Relative %: 71 %
PLATELETS: 273 10*3/uL (ref 150–400)
RBC: 5.44 MIL/uL (ref 4.22–5.81)
RDW: 13.4 % (ref 11.5–15.5)
WBC: 14.8 10*3/uL — ABNORMAL HIGH (ref 4.0–10.5)

## 2016-09-05 LAB — COMPREHENSIVE METABOLIC PANEL
ALT: 34 U/L (ref 17–63)
AST: 17 U/L (ref 15–41)
Albumin: 4.5 g/dL (ref 3.5–5.0)
Alkaline Phosphatase: 41 U/L (ref 38–126)
Anion gap: 10 (ref 5–15)
BUN: 16 mg/dL (ref 6–20)
CHLORIDE: 98 mmol/L — AB (ref 101–111)
CO2: 24 mmol/L (ref 22–32)
CREATININE: 1.03 mg/dL (ref 0.61–1.24)
Calcium: 9.4 mg/dL (ref 8.9–10.3)
GFR calc non Af Amer: >60 mL/min (ref >60)
Glucose, Bld: 323 mg/dL — ABNORMAL HIGH (ref 65–99)
POTASSIUM: 3.9 mmol/L (ref 3.5–5.1)
Sodium: 132 mmol/L — ABNORMAL LOW (ref 135–145)
Total Bilirubin: 0.9 mg/dL (ref 0.3–1.2)
Total Protein: 7.8 g/dL (ref 6.5–8.1)

## 2016-09-05 MED ORDER — SODIUM CHLORIDE 0.9 % IV BOLUS (SEPSIS)
1000.0000 mL | Freq: Once | INTRAVENOUS | Status: AC
  Administered 2016-09-06: 1000 mL via INTRAVENOUS

## 2016-09-05 MED ORDER — ONDANSETRON HCL 4 MG/2ML IJ SOLN
4.0000 mg | Freq: Once | INTRAMUSCULAR | Status: AC
  Administered 2016-09-05: 4 mg via INTRAVENOUS
  Filled 2016-09-05: qty 2

## 2016-09-05 MED ORDER — MORPHINE SULFATE (PF) 4 MG/ML IV SOLN
4.0000 mg | Freq: Once | INTRAVENOUS | Status: AC
Start: 2016-09-06 — End: 2016-09-05
  Administered 2016-09-05: 4 mg via INTRAVENOUS
  Filled 2016-09-05: qty 1

## 2016-09-05 MED ORDER — ACETAMINOPHEN 325 MG PO TABS
650.0000 mg | ORAL_TABLET | Freq: Once | ORAL | Status: AC
  Administered 2016-09-05: 650 mg via ORAL
  Filled 2016-09-05: qty 2

## 2016-09-05 NOTE — ED Triage Notes (Signed)
C/o LLQ pain yesterday-chills, fever x today-also c/o pain to right thigh x 3-4 days-NAD-steady gait

## 2016-09-05 NOTE — ED Notes (Signed)
C/o llq abd pain w nausea abd diarrhea onset yesterday  Denies vomiting

## 2016-09-05 NOTE — ED Provider Notes (Signed)
[facility] Provider [facility]   CSN: [id number] Arrival date & time: 09/05/16  2123  By signing my name below, I, Richard Murray, attest that this documentation has been prepared under the direction and in the presence of [name], MD. Electronically Signed: Rosario Murray, ED Scribe. 09/05/16. 11:45 PM.  History   Chief Complaint Chief Complaint  Patient presents with  . Abdominal Pain   The history is provided by the patient and medical records. No language interpreter was used.    HPI Comments: Richard Murray is a 46 y.o. male with a PMHx of DM, asthma, diverticulitis s/p partial colectomy, IBS, and gastric ulcers, who presents to the Emergency Department complaining of persistent, worsening left-sided lower quadrant abdominal pain beginning yesterday. He notes associated diarrhea, increased straining w/ bowel movements, subjective fever, chills. Pt has a h/o diverticulitis s/p colectomy and his pain today is similar; he was last admitted for this issue on 10/13 (~1.5 years ago), per medical records. Pt notes that he has recently developed an allergy to Cipro and Flagyl after taking it for several years, but he is unsure of which one. He notes that he has been increasingly stressed recently d/t his mother's passing and has had a poor diet because he has been travelling often. There are no alleviating or modifying factors. There are no other associated systemic symptoms.  Past Medical History:  Diagnosis Date  . Asthma   . Diabetes mellitus without complication (HCC)   . Diverticulitis   . Diverticulitis   . Gastric ulcer   . IBS (irritable bowel syndrome)    There are no active problems to display for this patient.  Past Surgical History:  Procedure Laterality Date  . COLON SURGERY      Home Medications    Prior to Admission medications   Medication Sig Start Date End Date Taking? Authorizing Provider  amoxicillin-clavulanate (AUGMENTIN) 875-125 MG  tablet Take 1 tablet by mouth every 8 (eight) hours. 09/06/16   [name], MD  Ascorbic Acid (VITAMIN C) 100 MG tablet Take 100 mg by mouth daily.    [provider]  Cholecalciferol (VITAMIN D PO) Take by mouth.    [provider]  famotidine (PEPCID) 10 MG tablet Take 10 mg by mouth 2 (two) times daily.    [provider]  fenofibrate (TRICOR) 145 MG tablet Take 145 mg by mouth daily.    [provider]  Inulin (FIBER CHOICE PO) Take by mouth.    [provider]  Liraglutide (VICTOZA [place]) Inject into the skin.    [provider]  LISINOPRIL PO Take by mouth.    [provider]  ondansetron (ZOFRAN ODT) 4 MG disintegrating tablet Take 1 tablet (4 mg total) by mouth every 8 (eight) hours as needed for nausea or vomiting. 09/06/16   [name], MD  oxyCODONE-acetaminophen (PERCOCET/ROXICET) 5-325 MG tablet Take 1-2 tablets by mouth every 6 (six) hours as needed for severe pain. 09/06/16   [name], MD  sitaGLIPtan-metformin (JANUMET) 50-500 MG per tablet Take 1 tablet by mouth 2 (two) times daily with a meal.    [provider]   Family History No family history on file.  Social History Social History  Substance Use Topics  . Smoking status: Never Smoker  . Smokeless tobacco: Never Used  . Alcohol use No   Allergies   Biphosphate; Ciprofloxacin hcl; Flagyl [metronidazole]; and Penicillins  Review of Systems Review of Systems  All other systems reviewed  and are negative.  Physical Exam Updated Vital Signs BP 120/88   Pulse 91   Temp 98.7 F (37.1 C)   Resp 18   Ht 6\' 5"  (1.956 m)   Wt 132.9 kg (293 lb)   SpO2 94%   BMI 34.74 kg/m  Vitals reviewed Physical Exam  Physical Examination: General appearance - alert, well appearing, and in no distress Mental status - alert, oriented to person, place, and time Eyes - no conjunctival injection, no scleral icterus Mouth - mucous membranes moist, pharynx  normal without lesions Chest - clear to auscultation, no wheezes, rales or rhonchi, symmetric air entry Heart - normal rate, regular rhythm, normal S1, S2, no murmurs, rubs, clicks or gallops Abdomen - soft, ttp in left lower abdomen, + gaurding, no rebound tenderness, NABS, nondistended, no masses or organomegaly Neurological - alert, oriented, normal speech Extremities - peripheral pulses normal, no pedal edema, no clubbing or cyanosis Skin - normal coloration and turgor, no rashes ED Treatments / Results  DIAGNOSTIC STUDIES: Oxygen Saturation is 96% on RA, normal by my interpretation.   COORDINATION OF CARE: 11:45 PM-Discussed next steps with pt. Pt verbalized understanding and is agreeable with the plan.   Labs (all labs ordered are listed, but only abnormal results are displayed) Labs Reviewed  CBC WITH DIFFERENTIAL/PLATELET - Abnormal; Notable for the following:       Result Value   WBC 14.8 (*)    Neutro Abs 10.6 (*)    Monocytes Absolute 1.1 (*)    All other components within normal limits  COMPREHENSIVE METABOLIC PANEL - Abnormal; Notable for the following:    Sodium 132 (*)    Chloride 98 (*)    Glucose, Bld 323 (*)    All other components within normal limits  URINALYSIS, ROUTINE W REFLEX MICROSCOPIC - Abnormal; Notable for the following:    Glucose, UA >=500 (*)    All other components within normal limits  URINALYSIS, MICROSCOPIC (REFLEX) - Abnormal; Notable for the following:    Bacteria, UA RARE (*)    Squamous Epithelial / LPF 0-5 (*)    All other components within normal limits  CBG MONITORING, ED - Abnormal; Notable for the following:    Glucose-Capillary 278 (*)    All other components within normal limits   EKG  EKG Interpretation None      Radiology Ct Abdomen Pelvis W Contrast  Result Date: 09/06/2016 CLINICAL DATA:  Left lower quadrant abdominal pain. Fever, chills and diarrhea. History of diverticulitis. EXAM: CT ABDOMEN AND PELVIS WITH  CONTRAST TECHNIQUE: Multidetector CT imaging of the abdomen and pelvis was performed using the standard protocol following bolus administration of intravenous contrast. CONTRAST:  ISOVUE-300 IOPAMIDOL (ISOVUE-300) INJECTION 61% COMPARISON:  CT 01/12/2015 FINDINGS: Lower chest: Linear atelectasis in both lung bases. No pleural fluid. Hepatobiliary: The liver is enlarged with significantly decreased density consistent with steatosis. No focal lesion. Calcified gallstones within physiologically distended gallbladder. No pericholecystic inflammation. No biliary dilatation. Pancreas: No ductal dilatation or inflammation. Spleen: 12 mm in low-density lesion in the inferior spleen is unchanged from prior exam, likely small hemangioma or cyst. Splenomegaly with spleen measuring 14.7 cm craniocaudal. Adrenals/Urinary Tract: No adrenal nodule. No hydronephrosis or perinephric edema. Unchanged cyst in the lower left kidney. Urinary bladder is physiologically distended without wall thickening. Stomach/Bowel: Inflamed diverticulum at the junction of the descending and sigmoid colon with surrounding fat stranding and minimal free fluid. Multiple additional noninflamed colonic diverticula. There are enteric sutures in the sigmoid  colon. Prominent fat lobule in the region of postsurgical change likely sequela of fat necrosis, stable from prior. Moderate stool burden. No small bowel obstruction. The appendix is normal. Stomach is physiologically distended. Vascular/Lymphatic: Mild aortic atherosclerosis without aneurysm. Small left external iliac nodes are likely reactive. Reproductive: Prostate is normal in size central calcifications. Other: No ascites or free air. Small fat containing umbilical hernia. Small adjacent fat containing supraumbilical hernia. There is fat within both inguinal canals. Musculoskeletal: Bilateral L5 pars interarticularis defects without listhesis. There are no acute or suspicious osseous  abnormalities. IMPRESSION: 1. Uncomplicated acute diverticulitis at the junction of the descending and sigmoid colon. No perforation or abscess. 2. Hepatosplenomegaly and hepatic steatosis. 3. Cholelithiasis without gallbladder inflammation. 4. Fat containing umbilical and supraumbilical fat hernias. 5. Mild aortic atherosclerosis. Electronically Signed   By: Rubye OaksMelanie  Ehinger M.D.   On: 09/06/2016 02:48    Procedures Procedures   Medications Ordered in ED Medications  acetaminophen (TYLENOL) tablet 650 mg (650 mg Oral Given 09/05/16 2141)  ondansetron (ZOFRAN) injection 4 mg (4 mg Intravenous Given 09/05/16 2356)  sodium chloride 0.9 % bolus 1,000 mL (0 mLs Intravenous Stopped 09/06/16 0218)  morphine 4 MG/ML injection 4 mg (4 mg Intravenous Given 09/05/16 2356)  iopamidol (ISOVUE-300) 61 % injection 125 mL (125 mLs Intravenous Contrast Given 09/06/16 0153)  morphine 4 MG/ML injection 4 mg (4 mg Intravenous Given 09/06/16 0218)  amoxicillin-clavulanate (AUGMENTIN) 875-125 MG per tablet 1 tablet (1 tablet Oral Given 09/06/16 0322)   Initial Impression / Assessment and Plan / ED Course  I have reviewed the triage vital signs and the nursing notes.  Pertinent labs & imaging results that were available during my care of the patient were reviewed by me and considered in my medical decision making (see chart for details).     Pt with hx of diverticulitis presenting with left lower abdominal pain.  Pt has some leukocytosis, abdominal CT scan is c/w diverticulitis- pt treated with IV fluids, pain nausea meds.  Vitals are improved, he is able to tolerate po fluids in the ED. Blood glucose is elelvated, but no signs of DKA.  Pt was started on augmentin due to multiple allergies but has tolerated augmentin in the past well.  Pt had no difficulty with first dose in the ED.  Discharged with strict return precautions.  Pt agreeable with plan.  Final Clinical Impressions(s) / ED Diagnoses   Final diagnoses:    Diverticulitis   New Prescriptions Discharge Medication List as of 09/06/2016  3:50 AM    START taking these medications   Details  amoxicillin-clavulanate (AUGMENTIN) 875-125 MG tablet Take 1 tablet by mouth every 8 (eight) hours., Starting Thu 09/06/2016, Print    ondansetron (ZOFRAN ODT) 4 MG disintegrating tablet Take 1 tablet (4 mg total) by mouth every 8 (eight) hours as needed for nausea or vomiting., Starting Thu 09/06/2016, Print    oxyCODONE-acetaminophen (PERCOCET/ROXICET) 5-325 MG tablet Take 1-2 tablets by mouth every 6 (six) hours as needed for severe pain., Starting Thu 09/06/2016, Print       I personally performed the services described in this documentation, which was scribed in my presence. The recorded information has been reviewed and is accurate.      Jerelyn ScottLinker, Martha, MD 09/06/16 [phone]

## 2016-09-06 LAB — CBG MONITORING, ED: GLUCOSE-CAPILLARY: 278 mg/dL — AB (ref 65–99)

## 2016-09-06 LAB — URINALYSIS, MICROSCOPIC (REFLEX)
RBC / HPF: NONE SEEN RBC/hpf (ref 0–5)
WBC, UA: NONE SEEN WBC/hpf (ref 0–5)

## 2016-09-06 LAB — URINALYSIS, ROUTINE W REFLEX MICROSCOPIC
Bilirubin Urine: NEGATIVE
Glucose, UA: >=500 mg/dL — AB
HGB URINE DIPSTICK: NEGATIVE
Ketones, ur: NEGATIVE mg/dL
Leukocytes, UA: NEGATIVE
Nitrite: NEGATIVE
PH: 5.5 (ref 5.0–8.0)
Protein, ur: NEGATIVE mg/dL
SPECIFIC GRAVITY, URINE: 1.019 (ref 1.005–1.030)

## 2016-09-06 MED ORDER — MORPHINE SULFATE (PF) 4 MG/ML IV SOLN
4.0000 mg | Freq: Once | INTRAVENOUS | Status: AC
  Administered 2016-09-06: 4 mg via INTRAVENOUS
  Filled 2016-09-06: qty 1

## 2016-09-06 MED ORDER — ONDANSETRON 4 MG PO TBDP: 4.0000 mg | ORAL_TABLET | Freq: Three times a day (TID) | ORAL | 0 refills | Status: DC | PRN

## 2016-09-06 MED ORDER — AMOXICILLIN-POT CLAVULANATE 875-125 MG PO TABS: 1.0000 | ORAL_TABLET | Freq: Three times a day (TID) | ORAL | 0 refills | Status: DC

## 2016-09-06 MED ORDER — OXYCODONE-ACETAMINOPHEN 5-325 MG PO TABS: 1.0000 | ORAL_TABLET | Freq: Four times a day (QID) | ORAL | 0 refills | Status: DC | PRN

## 2016-09-06 MED ORDER — IOPAMIDOL (ISOVUE-300) INJECTION 61%
125.0000 mL | Freq: Once | INTRAVENOUS | Status: AC | PRN
  Administered 2016-09-06: 125 mL via INTRAVENOUS

## 2016-09-06 MED ORDER — AMOXICILLIN-POT CLAVULANATE 875-125 MG PO TABS
1.0000 | ORAL_TABLET | Freq: Once | ORAL | Status: AC
  Administered 2016-09-06: 1 via ORAL
  Filled 2016-09-06: qty 1

## 2016-09-06 NOTE — Discharge Instructions (Signed)
Return to the ED with any concerns including worsening abdominal pain, vomiting and not able to keep down liquids or antibiotics, fever/chills, decreased level of alertness/lethargy, or any other alarming symptoms

## 2016-09-10 ENCOUNTER — Emergency Department (HOSPITAL_BASED_OUTPATIENT_CLINIC_OR_DEPARTMENT_OTHER)
Admission: EM | Admit: 2016-09-10 | Discharge: 2016-09-10 | Disposition: A | Discharge status: Other Acute Inpatient Hospital | Payer: 59 | Attending: Emergency Medicine | Admitting: Emergency Medicine

## 2016-09-10 ENCOUNTER — Encounter (HOSPITAL_BASED_OUTPATIENT_CLINIC_OR_DEPARTMENT_OTHER): Payer: Self-pay

## 2016-09-10 ENCOUNTER — Emergency Department (HOSPITAL_BASED_OUTPATIENT_CLINIC_OR_DEPARTMENT_OTHER): Payer: 59

## 2016-09-10 DIAGNOSIS — Z79899 Other long term (current) drug therapy: Secondary | ICD-10-CM | POA: Diagnosis not present

## 2016-09-10 DIAGNOSIS — E119 Type 2 diabetes mellitus without complications: Secondary | ICD-10-CM | POA: Insufficient documentation

## 2016-09-10 DIAGNOSIS — K5793 Diverticulitis of intestine, part unspecified, without perforation or abscess with bleeding: Secondary | ICD-10-CM | POA: Insufficient documentation

## 2016-09-10 DIAGNOSIS — J45909 Unspecified asthma, uncomplicated: Secondary | ICD-10-CM | POA: Diagnosis not present

## 2016-09-10 DIAGNOSIS — K5792 Diverticulitis of intestine, part unspecified, without perforation or abscess without bleeding: Secondary | ICD-10-CM

## 2016-09-10 DIAGNOSIS — R1032 Left lower quadrant pain: Secondary | ICD-10-CM | POA: Diagnosis present

## 2016-09-10 LAB — CBC WITH DIFFERENTIAL/PLATELET
BASOS ABS: 0.1 10*3/uL (ref 0.0–0.1)
BASOS PCT: 1 %
EOS ABS: 0.2 10*3/uL (ref 0.0–0.7)
EOS PCT: 2 %
HEMATOCRIT: 41.6 % (ref 39.0–52.0)
Hemoglobin: 14.2 g/dL (ref 13.0–17.0)
Lymphocytes Relative: 25 %
Lymphs Abs: 2.4 10*3/uL (ref 0.7–4.0)
MCH: 28.1 pg (ref 26.0–34.0)
MCHC: 34.1 g/dL (ref 30.0–36.0)
MCV: 82.2 fL (ref 78.0–100.0)
MONO ABS: 0.7 10*3/uL (ref 0.1–1.0)
MONOS PCT: 7 %
NEUTROS ABS: 6.3 10*3/uL (ref 1.7–7.7)
Neutrophils Relative %: 65 %
PLATELETS: 278 10*3/uL (ref 150–400)
RBC: 5.06 MIL/uL (ref 4.22–5.81)
RDW: 13.1 % (ref 11.5–15.5)
WBC: 9.7 10*3/uL (ref 4.0–10.5)

## 2016-09-10 LAB — URINALYSIS, MICROSCOPIC (REFLEX)
RBC / HPF: NONE SEEN RBC/hpf (ref 0–5)
WBC, UA: NONE SEEN WBC/hpf (ref 0–5)

## 2016-09-10 LAB — COMPREHENSIVE METABOLIC PANEL
ALT: 28 U/L (ref 17–63)
ANION GAP: 9 (ref 5–15)
AST: 22 U/L (ref 15–41)
Albumin: 4.2 g/dL (ref 3.5–5.0)
Alkaline Phosphatase: 33 U/L — ABNORMAL LOW (ref 38–126)
BILIRUBIN TOTAL: 0.7 mg/dL (ref 0.3–1.2)
BUN: 15 mg/dL (ref 6–20)
CHLORIDE: 99 mmol/L — AB (ref 101–111)
CO2: 27 mmol/L (ref 22–32)
Calcium: 9.2 mg/dL (ref 8.9–10.3)
Creatinine, Ser: 0.97 mg/dL (ref 0.61–1.24)
GFR calc Af Amer: >60 mL/min (ref >60)
GFR calc non Af Amer: >60 mL/min (ref >60)
GLUCOSE: 286 mg/dL — AB (ref 65–99)
POTASSIUM: 4.7 mmol/L (ref 3.5–5.1)
Sodium: 135 mmol/L (ref 135–145)
Total Protein: 7.2 g/dL (ref 6.5–8.1)

## 2016-09-10 LAB — URINALYSIS, ROUTINE W REFLEX MICROSCOPIC
Bilirubin Urine: NEGATIVE
Glucose, UA: >=500 mg/dL — AB
Hgb urine dipstick: NEGATIVE
KETONES UR: NEGATIVE mg/dL
LEUKOCYTES UA: NEGATIVE
NITRITE: NEGATIVE
PROTEIN: NEGATIVE mg/dL
Specific Gravity, Urine: 1.028 (ref 1.005–1.030)
pH: 5 (ref 5.0–8.0)

## 2016-09-10 LAB — CBG MONITORING, ED: Glucose-Capillary: 290 mg/dL — ABNORMAL HIGH (ref 65–99)

## 2016-09-10 LAB — LIPASE, BLOOD: LIPASE: 42 U/L (ref 11–51)

## 2016-09-10 MED ORDER — ONDANSETRON HCL 4 MG/2ML IJ SOLN
4.0000 mg | Freq: Once | INTRAMUSCULAR | Status: AC
  Administered 2016-09-10: 4 mg via INTRAVENOUS

## 2016-09-10 MED ORDER — MORPHINE SULFATE (PF) 4 MG/ML IV SOLN
4.0000 mg | Freq: Once | INTRAVENOUS | Status: AC
  Administered 2016-09-10: 4 mg via INTRAVENOUS
  Filled 2016-09-10: qty 1

## 2016-09-10 MED ORDER — ONDANSETRON HCL 4 MG/2ML IJ SOLN
4.0000 mg | Freq: Once | INTRAMUSCULAR | Status: AC
  Administered 2016-09-10: 4 mg via INTRAVENOUS
  Filled 2016-09-10: qty 2

## 2016-09-10 MED ORDER — IOPAMIDOL (ISOVUE-300) INJECTION 61%
100.0000 mL | Freq: Once | INTRAVENOUS | Status: AC | PRN
  Administered 2016-09-10: 100 mL via INTRAVENOUS

## 2016-09-10 MED ORDER — ONDANSETRON HCL 4 MG/2ML IJ SOLN
INTRAMUSCULAR | Status: AC
  Filled 2016-09-10: qty 2

## 2016-09-10 MED ORDER — SODIUM CHLORIDE 0.9 % IV BOLUS (SEPSIS)
1000.0000 mL | Freq: Once | INTRAVENOUS | Status: AC
  Administered 2016-09-10: 1000 mL via INTRAVENOUS

## 2016-09-10 NOTE — ED Notes (Signed)
Patient transported to CT 

## 2016-09-10 NOTE — ED Provider Notes (Signed)
[facility] Provider Note   CSN: [id number] Arrival date & time: 09/10/16  1643  By signing my name below, I, Diona BrownerJennifer Gorman, attest that this documentation has been prepared under the direction and in the presence of Rotha Cassels, PA-C. Electronically Signed: Diona BrownerJennifer Gorman, ED Scribe. 09/10/16. 5:22 PM.  History   Chief Complaint Chief Complaint  Patient presents with  . Abdominal Pain    HPI Richard Murray is a 46 y.o. male with a PMHx of diverticulitis and DM, who presents to the Emergency Department complaining of intermittent, 10/10, LLQ pain for the last three hours. Pt was here  Approximately 5 days ago and was dx with Uncomplicated diverticulitis. Still taking Augmentin from his last visit as directed. He notes his pain mildly improved from last week after discharge from the ED, however, the last three hours his pain has worsened. Associated sx include diarrhea, nausea despite use of nausea medication, lightheadedness. Shooting pain to his testicles when he tries to urinate at times. Partial colectomy was done approximately 3 years ago due to diverticulitis. Reports decreased appetite but has been drinking fluids. Reports compliance with diabetes medications. No hx of kidney stones. Pt denies fever, blood in stool, chest pain, trouble breathing, urinary symptoms, syncope, vomiting.  The history is provided by the patient and medical records. No language interpreter was used.    Past Medical History:  Diagnosis Date  . Asthma   . Diabetes mellitus without complication (HCC)   . Diverticulitis   . Diverticulitis   . Gastric ulcer   . IBS (irritable bowel syndrome)     There are no active problems to display for this patient.   Past Surgical History:  Procedure Laterality Date  . COLON SURGERY         Home Medications    Prior to Admission medications   Medication Sig Start Date End Date Taking? Authorizing Provider  amoxicillin-clavulanate  (AUGMENTIN) 875-125 MG tablet Take 1 tablet by mouth every 8 (eight) hours. 09/06/16   Jerelyn ScottLinker, Martha, MD  Ascorbic Acid (VITAMIN C) 100 MG tablet Take 100 mg by mouth daily.    [provider]  Cholecalciferol (VITAMIN D PO) Take by mouth.    [provider]  famotidine (PEPCID) 10 MG tablet Take 10 mg by mouth 2 (two) times daily.    [provider]  fenofibrate (TRICOR) 145 MG tablet Take 145 mg by mouth daily.    [provider]  Inulin (FIBER CHOICE PO) Take by mouth.    [provider]  Liraglutide (VICTOZA [place]) Inject into the skin.    [provider]  LISINOPRIL PO Take by mouth.    [provider]  ondansetron (ZOFRAN ODT) 4 MG disintegrating tablet Take 1 tablet (4 mg total) by mouth every 8 (eight) hours as needed for nausea or vomiting. 09/06/16   Jerelyn ScottLinker, Martha, MD  oxyCODONE-acetaminophen (PERCOCET/ROXICET) 5-325 MG tablet Take 1-2 tablets by mouth every 6 (six) hours as needed for severe pain. 09/06/16   Jerelyn ScottLinker, Martha, MD  sitaGLIPtan-metformin (JANUMET) 50-500 MG per tablet Take 1 tablet by mouth 2 (two) times daily with a meal.    [provider]    Family History No family history on file.  Social History Social History  Substance Use Topics  . Smoking status: Never Smoker  . Smokeless tobacco: Never Used  . Alcohol use No     Allergies   Biphosphate; Ciprofloxacin hcl; Flagyl [metronidazole]; and Penicillins   Review of Systems Review  of Systems  Constitutional: Positive for appetite change. Negative for chills and fever.  HENT: Negative for drooling and sore throat.   Cardiovascular: Negative for chest pain.  Gastrointestinal: Positive for abdominal pain, diarrhea and nausea. Negative for blood in stool and vomiting.  Endocrine: Negative for polyuria.  Genitourinary: Negative for dysuria, flank pain, hematuria and urgency.  Musculoskeletal: Negative for back pain and myalgias.  Neurological:  Positive for light-headedness.  All other systems reviewed and are negative.    Physical Exam Updated Vital Signs BP 108/80 (BP Location: Right Arm)   Pulse 81   Temp 98 F (36.7 C) (Oral)   Resp 16   Ht 6\' 5"  (1.956 m)   Wt 132.2 kg (291 lb 6 oz)   SpO2 96%   BMI 34.55 kg/m   Physical Exam  Constitutional: He appears well-developed and well-nourished. No distress.  HENT:  Head: Normocephalic and atraumatic.  Eyes: Conjunctivae and EOM are normal. No scleral icterus.  Neck: Normal range of motion.  Cardiovascular: Normal rate, regular rhythm and normal heart sounds.   Pulmonary/Chest: Effort normal and breath sounds normal. No respiratory distress.  Abdominal: Soft. Bowel sounds are normal. He exhibits no distension. There is tenderness in the left lower quadrant. There is no rigidity, no rebound and no guarding.  Left lower quadrant tenderness to palpation. No rebound, guarding.  Neurological: He is alert.  Skin: No rash noted. He is not diaphoretic.  Psychiatric: He has a normal mood and affect.  Nursing note and vitals reviewed.    ED Treatments / Results  DIAGNOSTIC STUDIES: Oxygen Saturation is 99% on RA, normal by my interpretation.   COORDINATION OF CARE: 5:22 PM-Discussed next steps with pt. Pt verbalized understanding and is agreeable with the plan.   Labs (all labs ordered are listed, but only abnormal results are displayed) Labs Reviewed  COMPREHENSIVE METABOLIC PANEL - Abnormal; Notable for the following:       Result Value   Chloride 99 (*)    Glucose, Bld 286 (*)    Alkaline Phosphatase 33 (*)    All other components within normal limits  URINALYSIS, ROUTINE W REFLEX MICROSCOPIC - Abnormal; Notable for the following:    Glucose, UA >=500 (*)    All other components within normal limits  URINALYSIS, MICROSCOPIC (REFLEX) - Abnormal; Notable for the following:    Bacteria, UA RARE (*)    Squamous Epithelial / LPF 0-5 (*)    All other components  within normal limits  CBG MONITORING, ED - Abnormal; Notable for the following:    Glucose-Capillary 290 (*)    All other components within normal limits  CBC WITH DIFFERENTIAL/PLATELET  LIPASE, BLOOD    EKG  EKG Interpretation None       Radiology Ct Abdomen Pelvis W Contrast  Result Date: 09/10/2016 CLINICAL DATA:  Left lower quadrant abdominal pain. History of diverticulitis and gastric ulcer. EXAM: CT ABDOMEN AND PELVIS WITH CONTRAST TECHNIQUE: Multidetector CT imaging of the abdomen and pelvis was performed using the standard protocol following bolus administration of intravenous contrast. CONTRAST:  ISOVUE-300 IOPAMIDOL (ISOVUE-300) INJECTION 61% COMPARISON:  09/06/2016 FINDINGS: Lower chest: Mild dependent atelectasis in both lower lobes, right greater than left. Hepatobiliary: Several dependent clustered gallstones in the gallbladder, largest 5 mm. Diffuse hepatic steatosis. Pancreas: Unremarkable Spleen: 1.7 by 1.5 cm hypodense lesion inferiorly in the spleen, previously 1.2 cm back on the 09/06/2007 but stable compared to the more recent exam from last week. This lesion is not thought  to be simple but could simply represent a hemangioma or other benign splenic lesion. Given the fairly mild increase in size of the past 9 years I am skeptical that this is a significant lesion. Adrenals/Urinary Tract: 2.0 by 1.7 cm cyst of the left kidney lower pole, stable. Otherwise unremarkable. Stomach/Bowel: Improved diverticulitis of the junction of the descending and sigmoid colon, with minimal residual stranding for example on image 77/2, but improved from prior. No extraluminal gas or abscess. Appendix normal.  Postoperative findings in the sigmoid colon Vascular/Lymphatic: Aortoiliac atherosclerotic vascular disease. No pathologic adenopathy. Reproductive: Curvilinear calcifications in the prostate gland. Other: In the left lower quadrant, a 3.9 by 2.8 cm fat density has marginal accentuated  density as on prior exams back through 2016, this probably represents an old area of lower omental fat necrosis. Musculoskeletal: Small supraumbilical and umbilical hernias contain adipose tissue. Fatty spermatic cords bilaterally. There is 3 mm of anterolisthesis at L5-S1 associated with chronic bilateral L5 pars defects. No impingement. IMPRESSION: 1. Improved inflammatory findings at the junction of the descending and sigmoid colon, compatible with improving diverticulitis. No abscess or extraluminal gas. 2. Other imaging findings of potential clinical significance: Mild dependent atelectasis in both lower lobes. Cholelithiasis. 1.7 by 1.5 cm hypodense lesion inferiorly in the spleen, not changed from most recent exam in only mildly increased in size over the past 9 years, highly likely to be benign and incidental. Left kidney lower pole cyst. Aortic Atherosclerosis (ICD10-I70.0). Small focus of remote fat necrosis in the left lower omentum. Supraumbilical and umbilical hernias contain adipose tissue. 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 Electronically Signed   By: Gaylyn Rong M.D.   On: 09/10/2016 20:36    Procedures Procedures (including critical care time)  Medications Ordered in ED Medications  ondansetron (ZOFRAN) 4 MG/2ML injection (not administered)  sodium chloride 0.9 % bolus 1,000 mL (0 mLs Intravenous Stopped 09/10/16 2012)  morphine 4 MG/ML injection 4 mg (4 mg Intravenous Given 09/10/16 1740)  ondansetron (ZOFRAN) injection 4 mg (4 mg Intravenous Given 09/10/16 1741)  morphine 4 MG/ML injection 4 mg (4 mg Intravenous Given 09/10/16 1905)  iopamidol (ISOVUE-300) 61 % injection 100 mL (100 mLs Intravenous Contrast Given 09/10/16 2012)  morphine 4 MG/ML injection 4 mg (4 mg Intravenous Given 09/10/16 2048)  ondansetron (ZOFRAN) injection 4 mg (4 mg Intravenous Given 09/10/16 2046)     Initial Impression / Assessment and Plan / ED Course  I have  reviewed the triage vital signs and the nursing notes.  Pertinent labs & imaging results that were available during my care of the patient were reviewed by me and considered in my medical decision making (see chart for details).     Patient presents to the ED with complaints of left lower quadrant pain. He was seen about 4 days ago and was diagnosed with uncomplicated diverticulitis per CT. He was put on Augmentin due to his allergies to Cipro and Flagyl. He returns today for severe pain that began approximately 3 hours PTA. Lab work showed no leukocytosis, no electrolyte abnormality or other abnormalities. Patient's pain and nausea somewhat controlled here with morphine and Zofran. I spoke to him about the fact that there is no need to rescan him at this point considering his WBC count decreased from 14 to 9 today. I asked him if he would prefer to be admitted for pain control and IV antibiotics based on his symptoms. He states that he would be comfortable  with this and would like to be admitted at [facility] regional for further evaluation. 1830: Spoke to Dr. Harvie Junior from [facility] regional hospitalist who requests that we obtain a CT scan of the abdomen. He states that he will admit the patient for pain control and IV antibiotics. Repeat CT scan negative for perforation or abscess. Showed improvement in diverticulitis from previous scan. Spoke to Dr. Lucianne Muss who states that they will admit the patient to [facility] regional for pain control and IV antibiotics. Pain control here in the ED with morphine and nausea controlled with Zofran.  Patient discussed with and seen by Dr. Jacqulyn Bath who agrees to above plan.  Final Clinical Impressions(s) / ED Diagnoses   Final diagnoses:  Diverticulitis    New Prescriptions Discharge Medication List as of 09/10/2016 11:04 PM     I personally performed the services described in this documentation, which was scribed in my presence. The recorded information has been  reviewed and is accurate.     Dietrich Pates, PA-C 09/11/16 1610    Maia Plan, MD 09/11/16 1026

## 2016-09-10 NOTE — ED Notes (Signed)
ED Provider at bedside. 

## 2016-09-10 NOTE — ED Notes (Signed)
Asked pt to obtain a urine sample

## 2016-09-10 NOTE — ED Notes (Signed)
Pt offered keys to nurse and states will call family for ride

## 2016-09-10 NOTE — ED Notes (Signed)
Pa made aware of need for pain meds

## 2016-09-10 NOTE — ED Triage Notes (Signed)
C/o LLQ pain "diverticulitis" x 1 week-seen here for same last week-NAD-steady gait

## 2016-09-10 NOTE — ED Notes (Signed)
[facility]

## 2016-10-11 IMAGING — CT CT ABD-PELV W/ CM
2 of 5 series · 17 of 46 positions shown, 19 images · IV contrast (APPLIED)
Comparison: 09/06/2007

CLINICAL DATA: RLQ and LLQ abd pain, ? Appendicitis, allergic
reaction after taking flagyl and cipro earlier today, no prior abd
surgery per pt.

EXAM:
CT ABDOMEN AND PELVIS WITH CONTRAST
TECHNIQUE: Multidetector CT imaging of the abdomen and pelvis was performed
using the standard protocol following bolus administration of
intravenous contrast.
CONTRAST:  25mL OMNIPAQUE IOHEXOL 300 MG/ML SOLN, 100mL OMNIPAQUE
IOHEXOL 300 MG/ML SOLN

[Series 2: abd/pelvis 5.0 b31f · axial · 0.98mm/px · z∈[+858,+1338]mm · 14 of 109 slices shown, 16 images]
[im 7/109  soft-tissue]
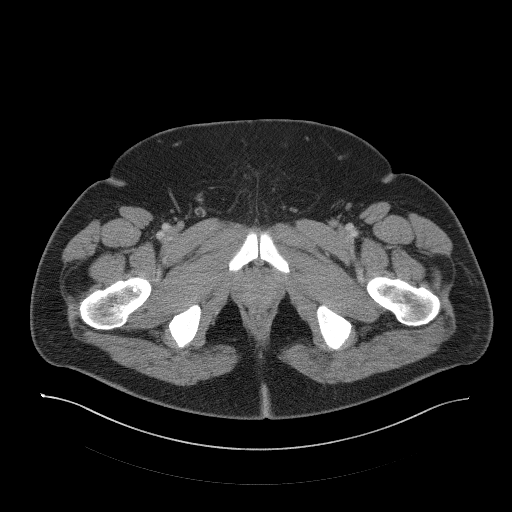
[im 7/109  bone]
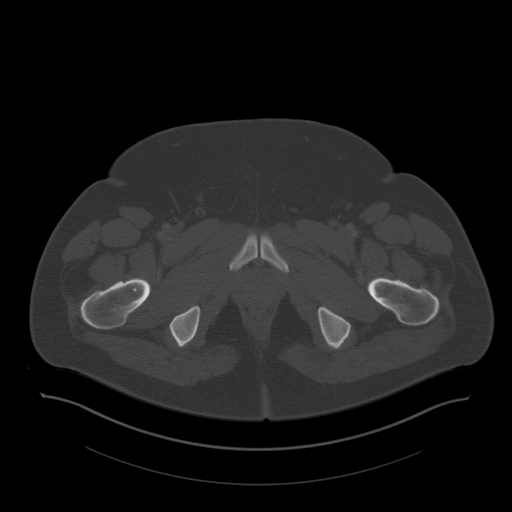
[im 13/109  soft-tissue]
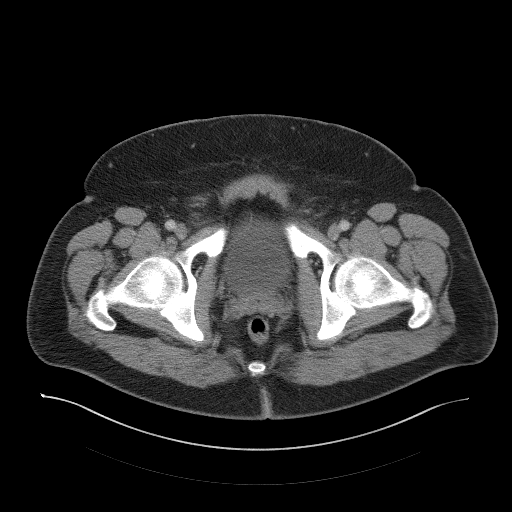
[im 25/109  soft-tissue]
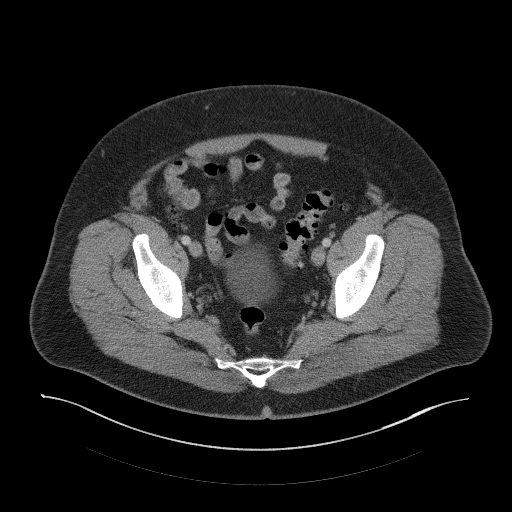
[im 31/109  soft-tissue]
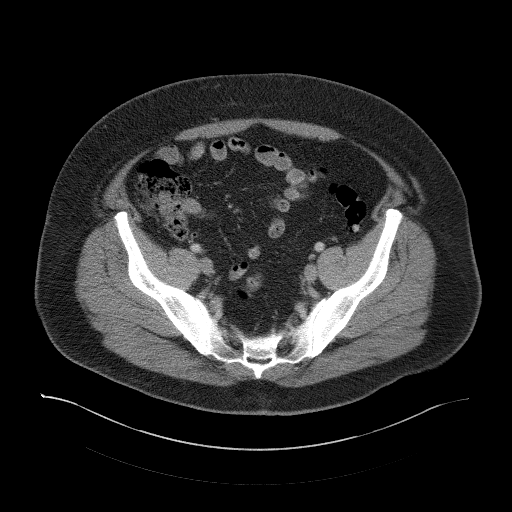
[im 37/109  soft-tissue]
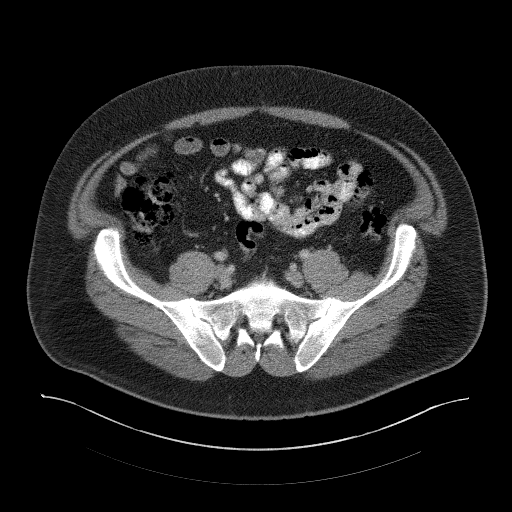
[im 43/109  soft-tissue]
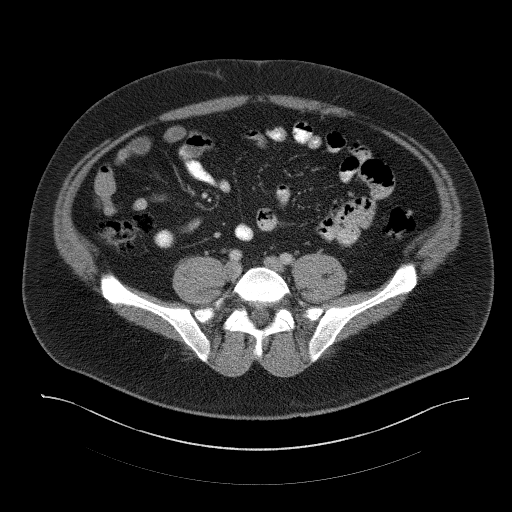
[im 49/109  soft-tissue]
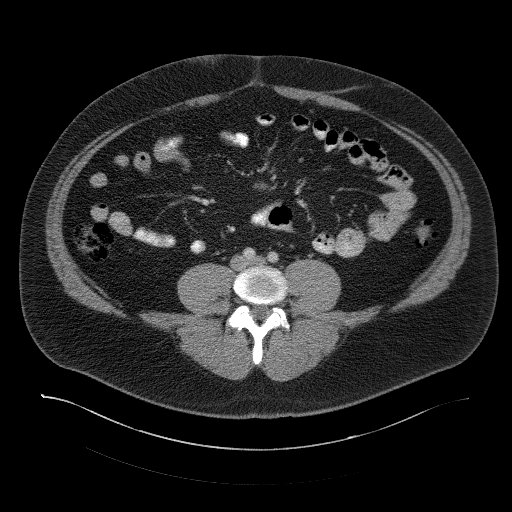
[im 61/109  soft-tissue]
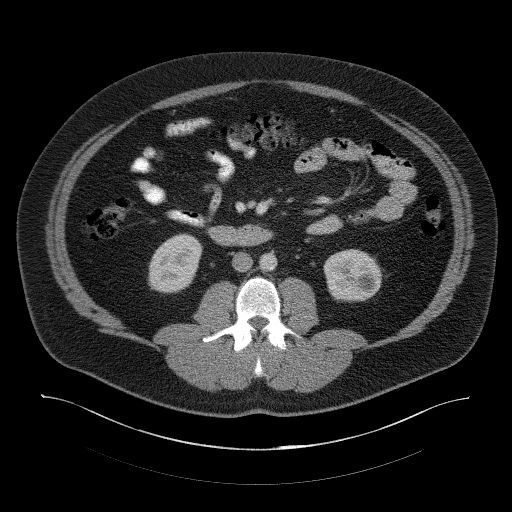
[im 67/109  soft-tissue]
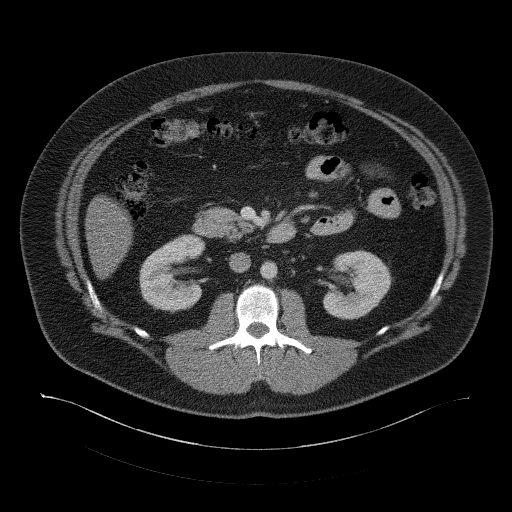
[im 67/109  bone]
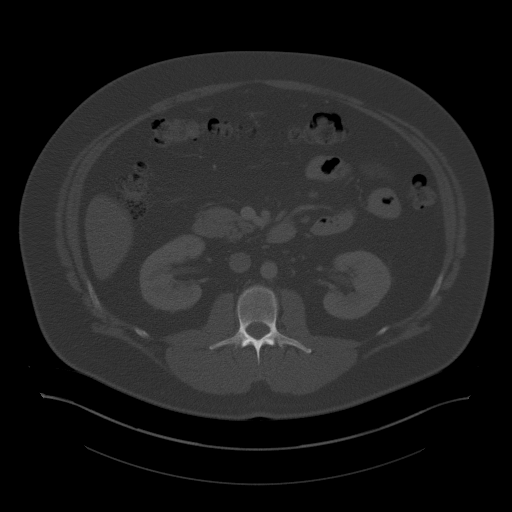
[im 73/109  soft-tissue]
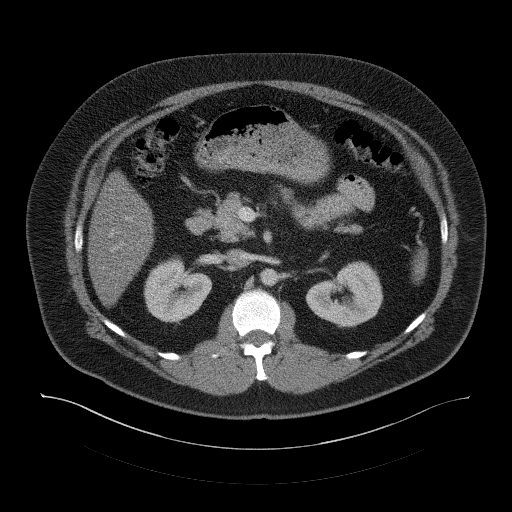
[im 79/109  soft-tissue]
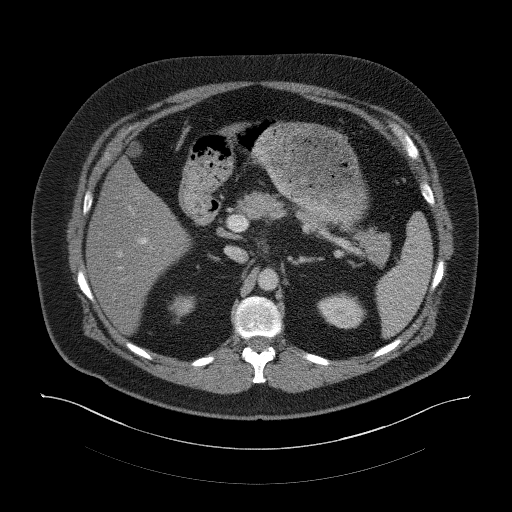
[im 85/109  soft-tissue]
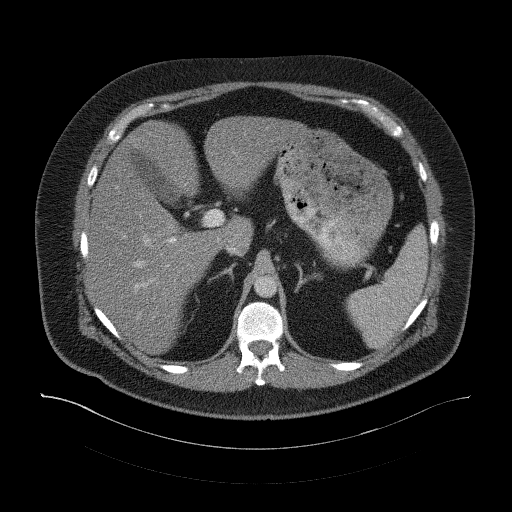
[im 97/109  soft-tissue]
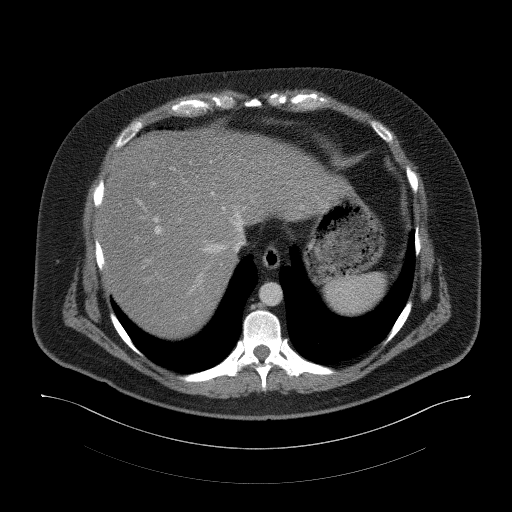
[im 103/109  soft-tissue]
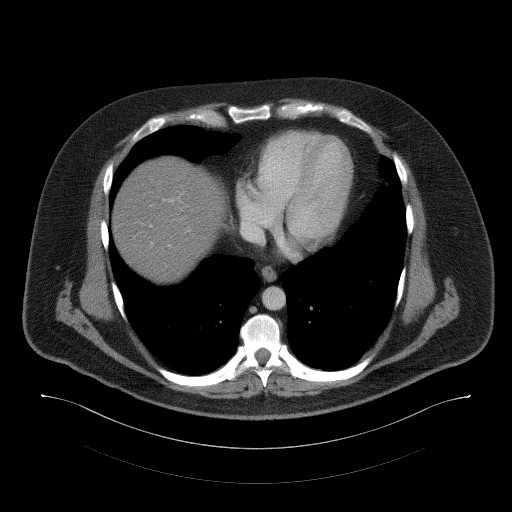

[Series 5: abd/pelvis 3.0 coronal · coronal · 1.05mm/px · 3 of 116 slices shown]
[im 39/116  soft-tissue]
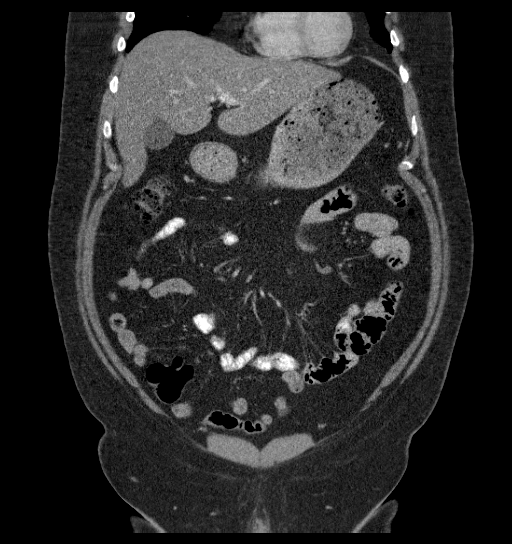
[im 52/116  soft-tissue]
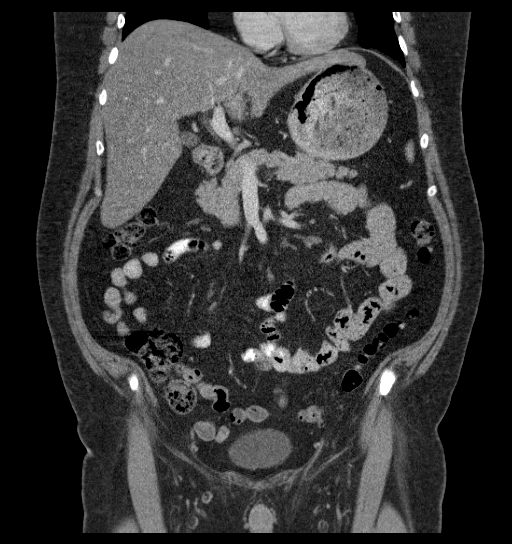
[im 64/116  soft-tissue]
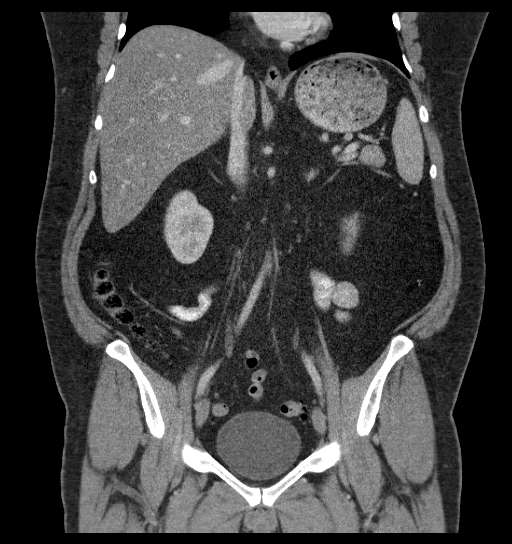

[17 of 46 positions shown; findings below may reference images not displayed]

FINDINGS: Lung bases are normal.

Abdominal images demonstrate mild diffuse hepatic steatosis. Mild
focal increased density over the gallbladder neck as cannot exclude
minimal stones. Remaining bones soft tissues are within normal. The
spleen, pancreas and adrenal glands are within normal. Kidneys are
normal in size without hydronephrosis or nephrolithiasis. There is a
1.9 cm cyst over the lower pole left kidney. Ureters are within
normal. The appendix is normal. There is mild diverticulosis of the
colon without active inflammation. Very minimal calcified plaque of
the abdominal aorta. Small bowel is within normal. There is no free
fluid or focal inflammatory change within the mesenteric.

Pelvic images demonstrate a normal bladder, rectum and prostate.
IMPRESSION: No acute findings in the abdomen/pelvis.

Suggestion minimal cholelithiasis.

1.9 cm simple left renal cyst.

Diverticulosis of the colon.

Minimal hepatic steatosis.

## 2020-05-16 ENCOUNTER — Other Ambulatory Visit: Payer: Self-pay

## 2020-05-16 ENCOUNTER — Encounter (HOSPITAL_BASED_OUTPATIENT_CLINIC_OR_DEPARTMENT_OTHER): Payer: Self-pay

## 2020-05-16 ENCOUNTER — Emergency Department (HOSPITAL_BASED_OUTPATIENT_CLINIC_OR_DEPARTMENT_OTHER)
Admission: EM | Admit: 2020-05-16 | Discharge: 2020-05-17 | Disposition: A | Discharge status: Home / Self Care | Payer: BC Managed Care – PPO | Attending: Emergency Medicine | Admitting: Emergency Medicine

## 2020-05-16 DIAGNOSIS — N23 Unspecified renal colic: Secondary | ICD-10-CM

## 2020-05-16 DIAGNOSIS — Z7984 Long term (current) use of oral hypoglycemic drugs: Secondary | ICD-10-CM | POA: Insufficient documentation

## 2020-05-16 DIAGNOSIS — J45909 Unspecified asthma, uncomplicated: Secondary | ICD-10-CM | POA: Diagnosis not present

## 2020-05-16 DIAGNOSIS — Z794 Long term (current) use of insulin: Secondary | ICD-10-CM | POA: Diagnosis not present

## 2020-05-16 DIAGNOSIS — R109 Unspecified abdominal pain: Secondary | ICD-10-CM | POA: Diagnosis present

## 2020-05-16 DIAGNOSIS — E119 Type 2 diabetes mellitus without complications: Secondary | ICD-10-CM | POA: Insufficient documentation

## 2020-05-16 LAB — BASIC METABOLIC PANEL
Anion gap: 12 (ref 5–15)
BUN: 18 mg/dL (ref 6–20)
CO2: 25 mmol/L (ref 22–32)
Calcium: 9.7 mg/dL (ref 8.9–10.3)
Chloride: 101 mmol/L (ref 98–111)
Creatinine, Ser: 1.08 mg/dL (ref 0.61–1.24)
GFR, Estimated: >60 mL/min (ref >60)
Glucose, Bld: 267 mg/dL — ABNORMAL HIGH (ref 70–99)
Potassium: 4.7 mmol/L (ref 3.5–5.1)
Sodium: 138 mmol/L (ref 135–145)

## 2020-05-16 LAB — URINALYSIS, MICROSCOPIC (REFLEX)

## 2020-05-16 LAB — URINALYSIS, ROUTINE W REFLEX MICROSCOPIC
Bilirubin Urine: NEGATIVE
Glucose, UA: NEGATIVE mg/dL
Ketones, ur: NEGATIVE mg/dL
Leukocytes,Ua: NEGATIVE
Nitrite: NEGATIVE
Protein, ur: NEGATIVE mg/dL
Specific Gravity, Urine: 1.03 (ref 1.005–1.030)
pH: 5 (ref 5.0–8.0)

## 2020-05-16 LAB — CBC
HCT: 44.2 % (ref 39.0–52.0)
Hemoglobin: 14.7 g/dL (ref 13.0–17.0)
MCH: 28.4 pg (ref 26.0–34.0)
MCHC: 33.3 g/dL (ref 30.0–36.0)
MCV: 85.3 fL (ref 80.0–100.0)
Platelets: 334 10*3/uL (ref 150–400)
RBC: 5.18 MIL/uL (ref 4.22–5.81)
RDW: 13.2 % (ref 11.5–15.5)
WBC: 13.9 10*3/uL — ABNORMAL HIGH (ref 4.0–10.5)
nRBC: 0 % (ref 0.0–0.2)

## 2020-05-16 MED ORDER — HYDROMORPHONE HCL 1 MG/ML IJ SOLN
1.0000 mg | Freq: Once | INTRAMUSCULAR | Status: AC
Start: 2020-05-17 — End: 2020-05-17
  Administered 2020-05-17: 1 mg via INTRAVENOUS
  Filled 2020-05-16: qty 1

## 2020-05-16 MED ORDER — ONDANSETRON HCL 4 MG/2ML IJ SOLN
4.0000 mg | Freq: Once | INTRAMUSCULAR | Status: AC
  Administered 2020-05-17: 4 mg via INTRAVENOUS
  Filled 2020-05-16: qty 2

## 2020-05-16 NOTE — ED Provider Notes (Signed)
[facility] Provider Note: Lowella Dell, MD, FACEP  CSN: [id number] MRN: [id number] ARRIVAL: 05/16/20 at 2104 ROOM: MH09/MH09   CHIEF COMPLAINT  Flank Pain   HISTORY OF PRESENT ILLNESS  05/16/20 11:46 PM Oswin Griffith is a 50 y.o. male with a 3-day history of left flank pain.  The pain is been intermittent, coming in episodes lasting up to 30 minutes to an hour.  Nothing brings these episodes on and nothing makes them better or worse.  The pain radiates to his left groin and it is different than previous diverticulitis pain.  He has had associated nausea and vomiting.  He has not noticed blood in his urine but has had, today, difficulty urinating and urination of small amounts.  He is not having pain with urination.  He is not having a fever.  He saw his PCP at [facility] today and had a CT scan but it has not yet been read.  He was prescribed Percocet but was unable to hold down a Percocet earlier and so got no relief.  He rates his pain is a 7 out of 10 currently but it has been more severe at times.   Past Medical History:  Diagnosis Date   Asthma    Diabetes mellitus without complication (HCC)    Diverticulitis    Gastric ulcer    IBS (irritable bowel syndrome)     Past Surgical History:  Procedure Laterality Date   COLON SURGERY      No family history on file.  Social History   Tobacco Use   Smoking status: Never Smoker   Smokeless tobacco: Never Used  Substance Use Topics   Alcohol use: No   Drug use: No    Prior to Admission medications   Medication Sig Start Date End Date Taking? Authorizing Provider  Ascorbic Acid (VITAMIN C) 100 MG tablet Take 100 mg by mouth daily.   Yes [provider]  Cholecalciferol (VITAMIN D PO) Take by mouth.   Yes [provider]  famotidine (PEPCID) 10 MG tablet Take 10 mg by mouth 2 (two) times daily.   Yes [provider]  fenofibrate (TRICOR) 145 MG tablet Take 145  mg by mouth daily.   Yes [provider]  HYDROmorphone (DILAUDID) 2 MG tablet Take 1 tablet (2 mg total) by mouth every 4 (four) hours as needed for severe pain. 05/17/20  Yes Veida Spira, MD  Inulin (FIBER CHOICE PO) Take by mouth.   Yes [provider]  Liraglutide (VICTOZA [place]) Inject into the skin.   Yes [provider]  LISINOPRIL PO Take by mouth.   Yes [provider]  ondansetron (ZOFRAN ODT) 8 MG disintegrating tablet Take 1 tablet (8 mg total) by mouth every 8 (eight) hours as needed for nausea or vomiting. 05/17/20  Yes Mccall Will, MD  sitaGLIPtan-metformin (JANUMET) 50-500 MG per tablet Take 1 tablet by mouth 2 (two) times daily with a meal.   Yes [provider]    Allergies Biphosphate, Ciprofloxacin hcl, Flagyl [metronidazole], and Penicillins   REVIEW OF SYSTEMS  Negative except as noted here or in the History of Present Illness.   PHYSICAL EXAMINATION  Initial Vital Signs Blood pressure (!) 174/105, pulse (!) 107, temperature 99 F (37.2 C), temperature source Oral, resp. rate 16, height 6\' 5"  (1.956 m), weight 136.1 kg, SpO2 98 %.  Examination General: Well-developed, well-nourished male in no acute distress; appearance consistent with age of record HENT: normocephalic; atraumatic  Eyes: pupils equal, round and reactive to light; extraocular muscles intact Neck: supple Heart: regular rate and rhythm Lungs: clear to auscultation bilaterally Abdomen: soft; nondistended; mild left lower quadrant tenderness; bowel sounds present GU: No CVA tenderness Extremities: No deformity; full range of motion; pulses normal Neurologic: Awake, alert and oriented; motor function intact in all extremities and symmetric; no facial droop Skin: Warm and dry Psychiatric: Normal mood and affect   RESULTS  Summary of this visit's results, reviewed and interpreted by myself:   EKG Interpretation  Date/Time:    Ventricular Rate:    PR  Interval:    QRS Duration:   QT Interval:    QTC Calculation:   R Axis:     Text Interpretation:        Laboratory Studies: Results for orders placed or performed during the hospital encounter of 05/16/20 (from the past 24 hour(s))  Urinalysis, Routine w reflex microscopic Urine, Clean Catch     Status: Abnormal   Collection Time: 05/16/20  9:30 PM  Result Value Ref Range   Color, Urine YELLOW YELLOW   APPearance CLOUDY (A) CLEAR   Specific Gravity, Urine 1.030 1.005 - 1.030   pH 5.0 5.0 - 8.0   Glucose, UA NEGATIVE NEGATIVE mg/dL   Hgb urine dipstick LARGE (A) NEGATIVE   Bilirubin Urine NEGATIVE NEGATIVE   Ketones, ur NEGATIVE NEGATIVE mg/dL   Protein, ur NEGATIVE NEGATIVE mg/dL   Nitrite NEGATIVE NEGATIVE   Leukocytes,Ua NEGATIVE NEGATIVE  Basic metabolic panel     Status: Abnormal   Collection Time: 05/16/20  9:30 PM  Result Value Ref Range   Sodium 138 135 - 145 mmol/L   Potassium 4.7 3.5 - 5.1 mmol/L   Chloride 101 98 - 111 mmol/L   CO2 25 22 - 32 mmol/L   Glucose, Bld 267 (H) 70 - 99 mg/dL   BUN 18 6 - 20 mg/dL   Creatinine, Ser 9.47 0.61 - 1.24 mg/dL   Calcium 9.7 8.9 - 09.6 mg/dL   GFR, Estimated >28 >36 mL/min   Anion gap 12 5 - 15  CBC     Status: Abnormal   Collection Time: 05/16/20  9:30 PM  Result Value Ref Range   WBC 13.9 (H) 4.0 - 10.5 K/uL   RBC 5.18 4.22 - 5.81 MIL/uL   Hemoglobin 14.7 13.0 - 17.0 g/dL   HCT 62.9 47.6 - 54.6 %   MCV 85.3 80.0 - 100.0 fL   MCH 28.4 26.0 - 34.0 pg   MCHC 33.3 30.0 - 36.0 g/dL   RDW 50.3 54.6 - 56.8 %   Platelets 334 150 - 400 K/uL   nRBC 0.0 0.0 - 0.2 %  Urinalysis, Microscopic (reflex)     Status: Abnormal   Collection Time: 05/16/20  9:30 PM  Result Value Ref Range   RBC / HPF 21-50 0 - 5 RBC/hpf   WBC, UA 0-5 0 - 5 WBC/hpf   Bacteria, UA RARE (A) NONE SEEN   Squamous Epithelial / LPF 0-5 0 - 5   Mucus PRESENT    Imaging Studies: DG Abdomen 1 View  Result Date: 05/17/2020 CLINICAL DATA:  Left flank pain  EXAM: ABDOMEN - 1 VIEW COMPARISON:  None. FINDINGS: The bowel gas pattern is normal. No radio-opaque calculi or other significant radiographic abnormality are seen. IMPRESSION: No visible nephroureterolithiasis. However, the sensitivity of radiography is much, much lower than that of CT for the detection of ureteral stones. Electronically Signed   By: Caryn Bee  Chase Picket M.D.   On: 05/17/2020 00:29    ED COURSE and MDM  Nursing notes, initial and subsequent vitals signs, including pulse oximetry, reviewed and interpreted by myself.  Vitals:   05/16/20 2116 05/16/20 2117 05/16/20 2303 05/17/20 0004  BP:  (!) 145/103 (!) 174/105 (!) 159/104  Pulse:  (!) 114 (!) 107 96  Resp:  20 16 16   Temp:  99 F (37.2 C)    TempSrc:  Oral    SpO2:  97% 98% 98%  Weight: 136.1 kg     Height: 6\' 5"  (1.956 m)      Medications  ondansetron (ZOFRAN) injection 4 mg (4 mg Intravenous Given 05/17/20 0007)  HYDROmorphone (DILAUDID) injection 1 mg (1 mg Intravenous Given 05/17/20 0008)   12:44 AM Pain and nausea well controlled at this time.  Presentation is consistent with ureteral stone but size or location of stone cannot be determined without CT scan, the results of which are still pending.  We will treat with a short course of Dilaudid as Percocet has not been adequate.  Patient has also not been given anything for nausea and we will provide Zofran.   PROCEDURES  Procedures   ED DIAGNOSES     ICD-10-CM   1. Ureteral colic  N23        Jirah Rider, MD 05/17/20 [phone]

## 2020-05-16 NOTE — ED Triage Notes (Addendum)
Pt c/o left flank pain/abd pain-no relief with oxycodone-states pain started 2/11-seen by PCP on 2/11 with labs-CT scan today-states he called office and advised scan would not be read until tomorrow-NAD-steady gait

## 2020-05-17 ENCOUNTER — Emergency Department (HOSPITAL_BASED_OUTPATIENT_CLINIC_OR_DEPARTMENT_OTHER): Payer: BC Managed Care – PPO

## 2020-05-17 MED ORDER — ONDANSETRON 8 MG PO TBDP: 8.0000 mg | ORAL_TABLET | Freq: Three times a day (TID) | ORAL | 0 refills | Status: AC | PRN

## 2020-05-17 MED ORDER — HYDROMORPHONE HCL 2 MG PO TABS: 2.0000 mg | ORAL_TABLET | ORAL | 0 refills | Status: AC | PRN

## 2020-05-17 NOTE — ED Notes (Signed)
Pt transported to XRAY °

## 2020-05-17 NOTE — ED Notes (Signed)
Patient verbalizes understanding of discharge instructions. Opportunity for questioning and answers were provided. Armband removed by staff, pt discharged from ED ambulatory to home.  

## 2023-09-09 ENCOUNTER — Encounter: Payer: Self-pay | Admitting: Physical Medicine and Rehabilitation

## 2023-11-04 ENCOUNTER — Encounter: Payer: Self-pay | Admitting: Physical Medicine and Rehabilitation
